# Patient Record
Sex: Female | Born: 1970 | Race: White | Hispanic: No | Marital: Married | Smoking: Never smoker
Health system: Southern US, Community
[De-identification: ages and names within clinical notes are randomized; demographics above are authoritative.]

## PROBLEM LIST (undated history)

## (undated) DIAGNOSIS — I10 Essential (primary) hypertension: Secondary | ICD-10-CM

## (undated) DIAGNOSIS — E785 Hyperlipidemia, unspecified: Secondary | ICD-10-CM

## (undated) HISTORY — DX: Hyperlipidemia, unspecified: E78.5

## (undated) HISTORY — PX: OTHER SURGICAL HISTORY: SHX169

## (undated) HISTORY — DX: Essential (primary) hypertension: I10

## (undated) HISTORY — PX: ORIF WRIST FRACTURE: SHX2133

## (undated) HISTORY — PX: OVARIAN CYST REMOVAL: SHX89

---

## 2009-07-11 ENCOUNTER — Ambulatory Visit: Payer: Self-pay | Admitting: Family Medicine

## 2009-07-31 ENCOUNTER — Ambulatory Visit: Payer: Self-pay | Admitting: Family Medicine

## 2009-07-31 DIAGNOSIS — S93409A Sprain of unspecified ligament of unspecified ankle, initial encounter: Secondary | ICD-10-CM | POA: Insufficient documentation

## 2009-07-31 DIAGNOSIS — E119 Type 2 diabetes mellitus without complications: Secondary | ICD-10-CM

## 2009-12-08 ENCOUNTER — Ambulatory Visit: Payer: Self-pay | Admitting: Family Medicine

## 2009-12-08 DIAGNOSIS — G609 Hereditary and idiopathic neuropathy, unspecified: Secondary | ICD-10-CM | POA: Insufficient documentation

## 2009-12-08 LAB — CONVERTED CEMR LAB: Hgb A1c MFr Bld: 14 %

## 2009-12-09 ENCOUNTER — Encounter: Payer: Self-pay | Admitting: Family Medicine

## 2009-12-09 LAB — CONVERTED CEMR LAB
Albumin: 4.1 g/dL (ref 3.5–5.2)
CO2: 22 meq/L (ref 19–32)
Chloride: 100 meq/L (ref 96–112)
Cholesterol: 198 mg/dL (ref 0–200)
Creatinine, Ser: 0.67 mg/dL (ref 0.40–1.20)
HDL: 30 mg/dL — ABNORMAL LOW (ref >39)
Potassium: 3.9 meq/L (ref 3.5–5.3)

## 2009-12-24 ENCOUNTER — Telehealth: Payer: Self-pay | Admitting: Family Medicine

## 2009-12-28 ENCOUNTER — Ambulatory Visit: Payer: Self-pay | Admitting: Family Medicine

## 2010-01-04 ENCOUNTER — Encounter: Payer: Self-pay | Admitting: Family Medicine

## 2010-01-26 ENCOUNTER — Telehealth: Payer: Self-pay | Admitting: Family Medicine

## 2010-01-28 ENCOUNTER — Encounter: Payer: Self-pay | Admitting: Family Medicine

## 2010-04-15 ENCOUNTER — Ambulatory Visit: Payer: Self-pay | Admitting: Family

## 2010-04-15 LAB — CONVERTED CEMR LAB: Hgb A1c MFr Bld: 10.9 %

## 2010-04-29 ENCOUNTER — Ambulatory Visit: Payer: Self-pay | Admitting: Family

## 2010-05-09 ENCOUNTER — Ambulatory Visit: Payer: Self-pay | Admitting: Emergency Medicine

## 2010-05-09 DIAGNOSIS — IMO0002 Reserved for concepts with insufficient information to code with codable children: Secondary | ICD-10-CM | POA: Insufficient documentation

## 2010-05-09 DIAGNOSIS — M25569 Pain in unspecified knee: Secondary | ICD-10-CM

## 2010-05-10 ENCOUNTER — Encounter: Payer: Self-pay | Admitting: Emergency Medicine

## 2010-05-19 ENCOUNTER — Telehealth: Payer: Self-pay | Admitting: Family

## 2010-05-27 ENCOUNTER — Telehealth: Payer: Self-pay | Admitting: Family Medicine

## 2010-05-31 ENCOUNTER — Ambulatory Visit: Payer: Self-pay | Admitting: Family Medicine

## 2010-06-15 ENCOUNTER — Telehealth: Payer: Self-pay | Admitting: Family Medicine

## 2010-06-15 ENCOUNTER — Encounter: Payer: Self-pay | Admitting: Family Medicine

## 2010-06-16 ENCOUNTER — Ambulatory Visit: Payer: Self-pay | Admitting: Family Medicine

## 2010-06-16 DIAGNOSIS — Z86718 Personal history of other venous thrombosis and embolism: Secondary | ICD-10-CM | POA: Insufficient documentation

## 2010-06-16 LAB — CONVERTED CEMR LAB: INR: 2

## 2010-06-23 ENCOUNTER — Ambulatory Visit: Payer: Self-pay | Admitting: Family Medicine

## 2010-06-23 LAB — CONVERTED CEMR LAB: INR: 4

## 2010-07-07 ENCOUNTER — Ambulatory Visit: Payer: Self-pay | Admitting: Family Medicine

## 2010-07-22 ENCOUNTER — Ambulatory Visit: Payer: Self-pay | Admitting: Family Medicine

## 2010-07-22 LAB — CONVERTED CEMR LAB: INR: 3.4

## 2010-07-29 ENCOUNTER — Ambulatory Visit: Payer: Self-pay | Admitting: Family Medicine

## 2010-07-29 LAB — CONVERTED CEMR LAB: INR: 3.7

## 2010-08-06 ENCOUNTER — Ambulatory Visit: Payer: Self-pay | Admitting: Family Medicine

## 2010-08-06 LAB — CONVERTED CEMR LAB: INR: 2.3

## 2010-08-18 ENCOUNTER — Telehealth (INDEPENDENT_AMBULATORY_CARE_PROVIDER_SITE_OTHER): Payer: Self-pay | Admitting: *Deleted

## 2010-08-20 ENCOUNTER — Ambulatory Visit: Payer: Self-pay | Admitting: Family Medicine

## 2010-08-20 LAB — CONVERTED CEMR LAB: INR: 2.3

## 2010-09-10 ENCOUNTER — Ambulatory Visit: Payer: Self-pay | Admitting: Family Medicine

## 2010-09-10 DIAGNOSIS — I10 Essential (primary) hypertension: Secondary | ICD-10-CM | POA: Insufficient documentation

## 2010-09-10 LAB — CONVERTED CEMR LAB: INR: 1.9

## 2010-09-13 ENCOUNTER — Ambulatory Visit: Payer: Self-pay | Admitting: Family Medicine

## 2010-09-14 ENCOUNTER — Ambulatory Visit: Payer: Self-pay | Admitting: Family Medicine

## 2010-09-16 ENCOUNTER — Telehealth (INDEPENDENT_AMBULATORY_CARE_PROVIDER_SITE_OTHER): Payer: Self-pay | Admitting: *Deleted

## 2010-09-20 ENCOUNTER — Telehealth (INDEPENDENT_AMBULATORY_CARE_PROVIDER_SITE_OTHER): Payer: Self-pay | Admitting: *Deleted

## 2010-10-05 ENCOUNTER — Telehealth: Payer: Self-pay | Admitting: Family Medicine

## 2010-10-21 ENCOUNTER — Ambulatory Visit
Admission: RE | Admit: 2010-10-21 | Discharge: 2010-10-21 | Payer: Self-pay | Source: Home / Self Care | Attending: Family Medicine | Admitting: Family Medicine

## 2010-10-21 LAB — CONVERTED CEMR LAB: INR: 1.5

## 2010-10-25 ENCOUNTER — Ambulatory Visit: Admit: 2010-10-25 | Payer: Self-pay | Admitting: Family Medicine

## 2010-10-28 ENCOUNTER — Ambulatory Visit
Admission: RE | Admit: 2010-10-28 | Discharge: 2010-10-28 | Payer: Self-pay | Source: Home / Self Care | Attending: Family Medicine | Admitting: Family Medicine

## 2010-11-02 ENCOUNTER — Encounter: Payer: Self-pay | Admitting: Family Medicine

## 2010-11-08 ENCOUNTER — Telehealth (INDEPENDENT_AMBULATORY_CARE_PROVIDER_SITE_OTHER): Payer: Self-pay | Admitting: *Deleted

## 2010-11-09 NOTE — Assessment & Plan Note (Signed)
Summary: DRESSING CHANGE   Vital Signs:  Patient Profile:   40 Years Old Female CC:      1 day F/U on Labia Abscess Height:     70 inches Weight:      273 pounds O2 Sat:      96 % O2 treatment:    Room Air Temp:     98.3 degrees F oral Pulse rate:   100 / minute Resp:     16 per minute BP sitting:   118 / 83  (left arm) Cuff size:   large  Vitals Entered By: Lajean Saver RN (September 14, 2010 10:02 AM)                  Updated Prior Medication List: NEURONTIN 300 MG CAPS (GABAPENTIN) Take 1 tablet by mouth three times a day LANTUS SOLOSTAR 100 UNIT/ML SOLN (INSULIN GLARGINE) 80 unit [place] daily * BD ULTRA-FINE NEEDLES use as directed METFORMIN HCL 1000 MG TABS (METFORMIN HCL) one tablet by mouth two times a day HYDROCODONE-ACETAMINOPHEN 5-325 MG TABS (HYDROCODONE-ACETAMINOPHEN) 1-2 tabs by mouth every 6 hours as needed . COUMADIN 5 MG TABS (WARFARIN SODIUM) Sunday - 5 mg, Monday - 5 mg, Tuesday - 5 mg, Wednesday - 5 mg, Thursday - 5 mg, Friday - 6 mg, Saturday - 5 mg COUMADIN 1 MG TABS (WARFARIN SODIUM) Sunday - 5 mg, Monday - 5 mg, Tuesday - 5 mg, Wednesday - 5 mg, Thursday - 5 mg, Friday - 6 mg, Saturday - 5 mg APIDRA SOLOSTAR 100 UNIT/ML SOLN (INSULIN GLULISINE) Per SSI. LISINOPRIL-HYDROCHLOROTHIAZIDE 20-25 MG TABS (LISINOPRIL-HYDROCHLOROTHIAZIDE) 1/2 tab by mouth dialy for one week, then inc to whole tab daily * INSULIN NEEDLES AND SYRINGLES FOR THE APIDRA VIALS. inject three times a day CEPHALEXIN 500 MG TABS (CEPHALEXIN) One by mouth q6hr  Current Allergies (reviewed today): No known allergies History of Present Illness Chief Complaint: 1 day F/U on Labia Abscess History of Present Illness:  Subjective:  Patient reports less pain.  No fever.  REVIEW OF SYSTEMS Constitutional Symptoms      Denies fever, chills, night sweats, weight loss, weight gain, and fatigue.  Eyes       Denies change in vision, eye pain, eye discharge, glasses, contact lenses, and eye  surgery. Ear/Nose/Throat/Mouth       Denies hearing loss/aids, change in hearing, ear pain, ear discharge, dizziness, frequent runny nose, frequent nose bleeds, sinus problems, sore throat, hoarseness, and tooth pain or bleeding.  Respiratory       Denies dry cough, productive cough, wheezing, shortness of breath, asthma, bronchitis, and emphysema/COPD.  Cardiovascular       Denies murmurs, chest pain, and tires easily with exhertion.    Gastrointestinal       Denies stomach pain, nausea/vomiting, diarrhea, constipation, blood in bowel movements, and indigestion. Genitourniary       Denies painful urination, kidney stones, and loss of urinary control.      Comments: abscess on labia Neurological       Denies paralysis, seizures, and fainting/blackouts. Musculoskeletal       Denies muscle pain, joint pain, joint stiffness, decreased range of motion, redness, swelling, muscle weakness, and gout.  Skin       Denies bruising, unusual mles/lumps or sores, and hair/skin or nail changes.  Psych       Denies mood changes, temper/anger issues, anxiety/stress, speech problems, depression, and sleep problems. Other Comments: pt  is here for a F/U for a abscess on  her labia. she states that it is feeling a little better and less pressure today.   Past History:  Past Medical History: Reviewed history from 07/31/2009 and no changes required. Diabetes mellitus, type II  Past Surgical History: Reviewed history from 12/08/2009 and no changes required. R open wrist reduction Ovarian cyst removed 2001 Uterine ablation 2008  Family History: Reviewed history from 12/08/2009 and no changes required. Family History Hypertension Father with esophageal Cancer, HTN Mother with DM.    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  Never Smoked Alcohol use-no Drug use-no Regular  exercise-Yes, jazzercise 6 days a week.    Objective:  Appears comfortable GU:  Left labia less swollen and erythematous.  Decreased tenderness.  Removed packing from I and D site:  no purlent drainage.  Applied new sterile dressing. Assessment New Problems: ENCOUNTER CHANGE/REMOVAL SURGICAL WOUND DRESSING (ICD-V58.31)   Plan New Orders: Est. Patient Level II [16109] Planning Comments:   Culture pending.  Continue antibiotic.  Begin warm soaks daily.  Change dressing daily until healed.  Return for worsening symptoms   The patient and/or caregiver has been counseled thoroughly with regard to medications prescribed including dosage, schedule, interactions, rationale for use, and possible side effects and they verbalize understanding.  Diagnoses and expected course of recovery discussed and will return if not improved as expected or if the condition worsens. Patient and/or caregiver verbalized understanding.   Orders Added: 1)  Est. Patient Level II [60454]

## 2010-11-09 NOTE — Assessment & Plan Note (Signed)
Summary: coumadin check  Nurse Visit   Vitals Entered By: Payton Spark CMA (July 22, 2010 3:05 PM)  Allergies: No Known Drug Allergies Laboratory Results   Blood Tests      INR: 3.4   (Normal Range: 0.88-1.12   Therap INR: 2.0-3.5)    Orders Added: 1)  Fingerstick [36416] 2)  Protime INR [24401]   Anticoagulation Management History:      The patient is on coumadin and comes in today for a routine follow up visit.  She is being anticoagulated because of the first episode of deep venous thrombosis and/or pulmonary embolism.  Anticipated length of treatment is 3-6 months.  Her last INR was 3.9 and today's INR is 3.4.    Anticoagulation Management Assessment/Plan:      The target INR is 2.0-3.0.  She is to have a PT/INR in 1 week.         Current Anticoagulation Instructions: The patient is to hold (do not take) the Thursday dose of coumadin.  The dosage to be resumed includes: Coumadin 5 mg tabs and Coumadin 7.5 mg tabs:  Sunday - 5 mg, Monday - 5 mg, Tuesday - 7.5 mg, Wednesday - 5 mg, Thursday - 5 mg, Friday - 5 mg, Saturday - 5 mg.  Repeat PT/INR in 1 week.

## 2010-11-09 NOTE — Assessment & Plan Note (Signed)
Summary: INFECTED INGROWN HAIR ON LABIA rm 5   Vital Signs:  Patient Profile:   40 Years Old Female CC:      ?infected ingrown hair to labia x 2 days Height:     70 inches Weight:      278 pounds O2 Sat:      97 % O2 treatment:    Room Air Temp:     98.5 degrees F oral Pulse rate:   113 / minute Resp:     20 per minute BP sitting:   122 / 86  (left arm) Cuff size:   large  Pt. in pain?   yes    Location:   labia  Vitals Entered By: Lajean Saver RN (September 13, 2010 8:23 AM)                   Updated Prior Medication List: NEURONTIN 300 MG CAPS (GABAPENTIN) Take 1 tablet by mouth three times a day LANTUS SOLOSTAR 100 UNIT/ML SOLN (INSULIN GLARGINE) 80 unit [place] daily * BD ULTRA-FINE NEEDLES use as directed METFORMIN HCL 1000 MG TABS (METFORMIN HCL) one tablet by mouth two times a day HYDROCODONE-ACETAMINOPHEN 5-325 MG TABS (HYDROCODONE-ACETAMINOPHEN) 1-2 tabs by mouth every 6 hours as needed . COUMADIN 5 MG TABS (WARFARIN SODIUM) Sunday - 5 mg, Monday - 5 mg, Tuesday - 5 mg, Wednesday - 5 mg, Thursday - 5 mg, Friday - 6 mg, Saturday - 5 mg COUMADIN 1 MG TABS (WARFARIN SODIUM) Sunday - 5 mg, Monday - 5 mg, Tuesday - 5 mg, Wednesday - 5 mg, Thursday - 5 mg, Friday - 6 mg, Saturday - 5 mg APIDRA SOLOSTAR 100 UNIT/ML SOLN (INSULIN GLULISINE) Per SSI. LISINOPRIL-HYDROCHLOROTHIAZIDE 20-25 MG TABS (LISINOPRIL-HYDROCHLOROTHIAZIDE) 1/2 tab by mouth dialy for one week, then inc to whole tab daily * INSULIN NEEDLES AND SYRINGLES FOR THE APIDRA VIALS. inject three times a day  Current Allergies: No known allergies History of Present Illness Chief Complaint: ?infected ingrown hair to labia x 2 days History of Present Illness:  Subjective:  Patient complains of two day history of persistent swelling in left labia, moderately painful.  She has been soaking in a hot tub but there has been no drainage.  No fevers, chills, and sweats   REVIEW OF SYSTEMS Constitutional Symptoms  Denies fever, chills, night sweats, weight loss, weight gain, and fatigue.  Eyes       Denies change in vision, eye pain, eye discharge, glasses, contact lenses, and eye surgery. Ear/Nose/Throat/Mouth       Denies hearing loss/aids, change in hearing, ear pain, ear discharge, dizziness, frequent runny nose, frequent nose bleeds, sinus problems, sore throat, hoarseness, and tooth pain or bleeding.  Respiratory       Denies dry cough, productive cough, wheezing, shortness of breath, asthma, bronchitis, and emphysema/COPD.  Cardiovascular       Denies murmurs, chest pain, and tires easily with exhertion.    Gastrointestinal       Denies stomach pain, nausea/vomiting, diarrhea, constipation, blood in bowel movements, and indigestion. Genitourniary       Denies painful urination, kidney stones, and loss of urinary control. Neurological       Denies paralysis, seizures, and fainting/blackouts. Musculoskeletal       Denies muscle pain, joint pain, joint stiffness, decreased range of motion, redness, swelling, muscle weakness, and gout.  Skin       Complains of hair/skni or nail changes.      Denies bruising and unusual mles/lumps  or sores.      Comments: infected ingrown hair to labia Psych       Denies mood changes, temper/anger issues, anxiety/stress, speech problems, depression, and sleep problems.  Past History:  Past Medical History: Reviewed history from 07/31/2009 and no changes required. Diabetes mellitus, type II  Past Surgical History: Reviewed history from 12/08/2009 and no changes required. R open wrist reduction Ovarian cyst removed 2001 Uterine ablation 2008  Family History: Reviewed history from 12/08/2009 and no changes required. Family History Hypertension Father with esophageal Cancer, HTN Mother with DM.    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  Never Smoked Alcohol use-no Drug use-no Regular exercise-Yes, jazzercise 6 days a week.    Objective:  No acute distress  Abdomen:  Nontender without masses or hepatosplenomegaly.  Bowel sounds are present.  No CVA or flank tenderness.  GU:  Left labia is swollen and tense but only mildly tender to palpation.   Assessment New Problems: OTHER ABSCESS OF VULVA (ICD-616.4)   Plan New Medications/Changes: CEPHALEXIN 500 MG TABS (CEPHALEXIN) One by mouth q6hr  #40 x 0, 09/13/2010, Donna Christen MD  New Orders: T-Culture, Wound [[id number]] New Patient Level III [16109] I&D Abscess, Simple / Single [10060] Planning Comments:   Wound culture taken.  Begin Keflex. Return tomorrow for packing removal and dressing change.   The patient and/or caregiver has been counseled thoroughly with regard to medications prescribed including dosage, schedule, interactions, rationale for use, and possible side effects and they verbalize understanding.  Diagnoses and expected course of recovery discussed and will return if not improved as expected or if the condition worsens. Patient and/or caregiver verbalized understanding.   PROCEDURE:   I & D Site: Left Labia Anesthesia: Local 1% lidocaine with epinephrine Procedure: Procedure:  Incise and Drain Abscess Risks and benefits of procedure explained to patient and verbal consent granted.  Using sterile technique and local anesthesia with 1% lidocaine with epinephrine, cleansed affected area with Betadine. Identified the most fluctuant area of lesion and incised with a #11 blade.  Expressed minimal amount of blood and purulent material.  Inserted Iodoform gauze packing.  Bandage applied.  Patient tolerated well.  Disposition: Home Prescriptions: CEPHALEXIN 500 MG TABS (CEPHALEXIN) One by mouth q6hr  #40 x 0   Entered and Authorized by:   Donna Christen MD   Signed by:   Donna Christen MD on 09/13/2010   Method used:   Print then Give to  Patient   RxID:   [id number]   Orders Added: 1)  T-Culture, Wound [[id number]] 2)  New Patient Level III [95621] 3)  I&D Abscess, Simple / Single [10060]

## 2010-11-09 NOTE — Progress Notes (Signed)
  Phone Note Outgoing Call   Call placed by: Clemens Catholic LPN,  September 16, 2010 3:45 PM Call placed to: Patient Summary of Call: call back: called and left message and asked pt to call back. Wound culture: positive for strep not staph. Cephalexin should clear this infection if not we can change or add another ABT per Dr. Orson Aloe. Initial call taken by: Clemens Catholic LPN,  September 16, 2010 3:48 PM

## 2010-11-09 NOTE — Letter (Signed)
Summary: Letter Regarding Disease Mgmt Program/[facility]  Letter Regarding Disease Mgmt Program/[facility]   Imported By: Lanelle Bal 02/04/2010 09:55:05  _____________________________________________________________________  External Attachment:    Type:   Image     Comment:   External Document

## 2010-11-09 NOTE — Assessment & Plan Note (Signed)
Summary: INR ck  Nurse Visit   Allergies: No Known Drug Allergies Laboratory Results   Blood Tests   Date/Time Received: 08/20/10 Date/Time Reported: 08/20/10   INR: 2.3   (Normal Range: 0.88-1.12   Therap INR: 2.0-3.5)    Orders Added: 1)  Fingerstick [36416] 2)  Protime INR [85610]   Anticoagulation Management History:      The patient is on coumadin and comes in today for a routine follow up visit.  Coumadin therapy is being given due to the first episode of deep venous thrombosis and/or pulmonary embolism.  Anticipated length of treatment is 3-6 months.  Her last INR was 2.3 and today's INR is 2.3.    Anticoagulation Management Assessment/Plan:      The target INR is 2.0-3.0.  She is to have a PT/INR in 3 weeks.  Anticoagulation instructions were given to patient.         Current Anticoagulation Instructions: The patient is to continue with the same dose of coumadin.  This dosage includes: Coumadin 5 mg tabs:  Sunday - 5 mg, Monday - 5 mg, Tuesday - 5 mg, Wednesday - 5 mg, Thursday - 5 mg, Friday - 5 mg, Saturday - 5 mg.  Repeat PT/INR in 3 weeks.

## 2010-11-09 NOTE — Assessment & Plan Note (Signed)
Summary: NOV: DM   Vital Signs:  Patient profile:   40 year old female Height:      70 inches Weight:      275 pounds BMI:     39.60 O2 Sat:      97 % on Room air Temp:     97.7 degrees F oral Pulse rate:   88 / minute Pulse rhythm:   regular Resp:     18 per minute BP sitting:   123 / 86  (left arm) Cuff size:   large  Vitals Entered By: Glendell Docker CMA (December 08, 2009 10:22 AM)  O2 Flow:  Room air  CC: New Patient  Comments establish care, no concerns, refill on medication, no regular pcp   Primary Care Provider:  Nani Gasser MD  CC:  New Patient .  History of Present Illness: No hx of normal paps.  Had ablation 2 months later for heavy bleeeding. Needs new pap. Had screenign mammo in 2009.    On glucophage for Diabetes for 6 years. On amaryl for that long.  Last A1C was a year ago was 7.?   In the meantime has lost 4 lbs.  Exercising regularly and has lost about 20 lbs over the last year. Off of diabetic medication for over a month because ran out. Does have hx of neuropathy. Describes burning in her feet in the evenings. No pain or problems during the day.   Diabetes Management History:      The patient is a 40 years old female who comes in for evaluation of DM Type 2.  She says that she is not exercising regularly.        Other comments include: Not really checking her sugars .    Habits & Providers  Alcohol-Tobacco-Diet     Alcohol drinks/day: 0     Tobacco Status: never  Exercise-Depression-Behavior     Does Patient Exercise: yes     STD Risk: never     Drug Use: never  Allergies (verified): No Known Drug Allergies  Past History:  Past Surgical History: R open wrist reduction Ovarian cyst removed 2001 Uterine ablation 2008  Family History: Family History Hypertension Father with esophageal Cancer, HTN Mother with DM.    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  Never  Smoked Alcohol use-no Drug use-no Regular exercise-Yes, jazzercise 6 days a week.  Does Patient Exercise:  yes Drug Use:  never STD Risk:  never  Review of Systems       No fever/sweats/weakness, unexplained weight loss/gain.  No vison changes.  No difficulty hearing/ringing in ears, hay fever/allergies.  No chest pain/discomfort, palpitations.  No Br lump/nipple discharge.  No cough/wheeze.  No blood in BM, nausea/vomiting/diarrhea.  No nighttime urination, leaking urine, unusual vaginal bleeding, discharge (penis or vagina).  No muscle/joint pain. No rash, change in mole.  No HA, memory loss.  No anxiety, sleep d/o, depression.  No easy bruising/bleeding, unexplained lump   Physical Exam  General:  Well-developed,well-nourished,in no acute distress; alert,appropriate and cooperative throughout examination Head:  Normocephalic and atraumatic without obvious abnormalities. No apparent alopecia or balding. Eyes:  No corneal or conjunctival inflammation noted. EOMI. Perrla. Neck:  No deformities, masses, or tenderness noted. Lungs:  Normal respiratory effort, chest expands symmetrically. Lungs are clear to auscultation, no crackles or wheezes. Heart:  Normal rate and regular rhythm. S1 and S2 normal without gallop, murmur, click, rub or  other extra sounds. N ocarotid or abdominal bruits.  Abdomen:  Bowel sounds positive,abdomen soft and non-tender without masses, organomegaly or hernias noted. Pulses:  Radial 2+  Extremities:  No LE edema.  Skin:  no rashes.   Cervical Nodes:  No lymphadenopathy noted Psych:  Cognition and judgment appear intact. Alert and cooperative with normal attention span and concentration. No apparent delusions, illusions, hallucinations  Diabetes Management Exam:    Foot Exam (with socks and/or shoes not present):       Sensory-Pinprick/Light touch:          Left medial foot (L-4): normal          Left dorsal foot (L-5): normal          Left lateral foot (S-1):  normal          Right medial foot (L-4): normal          Right dorsal foot (L-5): normal          Right lateral foot (S-1): normal       Sensory-Monofilament:          Left foot: normal          Right foot: normal       Inspection:          Left foot: normal          Right foot: normal       Nails:          Left foot: normal          Right foot: normal   Impression & Recommendations:  Problem # 1:  DIABETES MELLITUS, TYPE II (ICD-250.00) Tetanus adn flu up to date.  Due for pneumococcal, given today.  Norml monofilament. REminded her to get her eye exam. Due for screening labs.  Spilling protein in urine, so will recheck once back on medication and the A1C under better control.  A!C is high but not suprising since off medication for almost 2 months. Will change the amaryl since pt is trying really hard to lose weight.   F/U in 1 month.  Bring in glucose log.  Says has a meter at home.  The following medications were removed from the medication list:    Glucophage 1000 Mg Tabs (Metformin hcl) .Marland Kitchen... 1 tab by mouth two times a day    Glipizide 5 Mg Tabs (Glipizide) .Marland Kitchen... 1 tab by mouth once daily Her updated medication list for this problem includes:    Janumet 50-1000 Mg Tabs (Sitagliptin-metformin hcl) .Marland Kitchen... Take 1 tablet by mouth two times a day  Orders: T-Comprehensive Metabolic Panel [id number]) T-Lipid Profile (09811-91478) T-TSH (29562-13086) Creatinine  (57846) Urine Microalbumin (82044) Hgb A1C (96295MW) Fingerstick (41324)  Complete Medication List: 1)  Janumet 50-1000 Mg Tabs (Sitagliptin-metformin hcl) .... Take 1 tablet by mouth two times a day 2)  Neurontin 300 Mg Caps (Gabapentin) .... Take 1 capsule by mouth two times a day  Other Orders: Pneumococcal Vaccine (40102) Admin 1st Vaccine (72536) Prescriptions: NEURONTIN 300 MG CAPS (GABAPENTIN) Take 1 capsule by mouth two times a day  #60 x 6   Entered and Authorized by:   Nani Gasser MD   Signed  by:   Nani Gasser MD on 12/08/2009   Method used:   Electronically to        [facility] [phone]* (retail)       [address] [place], [place]  [address]  Ph: [phone]       Fax: [phone]   RxID:   [id number] JANUMET 50-1000 MG TABS (SITAGLIPTIN-METFORMIN HCL) Take 1 tablet by mouth two times a day  #60 x 2   Entered and Authorized by:   Nani Gasser MD   Signed by:   Nani Gasser MD on 12/08/2009   Method used:   Electronically to        [facility] [phone]* (retail)       [address] [place], [place]  [address]       Ph: [phone]       Fax: [phone]   RxID:   [id number]    Immunization History:  Tetanus/Td Immunization History:    Tetanus/Td:  historical (02/17/2004)  Immunizations Administered:  Pneumonia Vaccine:    Vaccine Type: Pneumovax    Site: right deltoid    Mfr: Merck    Dose: 0.5 ml    Route: IM    Given by: Glendell Docker CMA    Exp. Date: 11/10/2010    Lot #: [id number]    VIS given: 07/15/2008    Preventive Care Screening  Pap Smear:    Date:  04/01/2008    Results:  normal   Last Tetanus Booster:    Date:  02/17/2004    Results:  Historical    Current Allergies (reviewed today): No known allergies   TD Result Date:  10/11/2003 TD Result:  given TD Next Due:  10 yr  Laboratory Results   Urine Tests    Microalbumin (urine): 80 mg/L Creatinine: 200mg /dL  A:C Ratio 01-601  Blood Tests     HGBA1C: 14.0%   (Normal Range: Non-Diabetic - 3-6%   Control Diabetic - 6-8%)

## 2010-11-09 NOTE — Assessment & Plan Note (Signed)
Summary: INR  Nurse Visit   Allergies: No Known Drug Allergies Laboratory Results   Blood Tests   Date/Time Received: 07/29/2010 Date/Time Reported: 07/29/2010   INR: 3.7   (Normal Range: 0.88-1.12   Therap INR: 2.0-3.5)    Orders Added: 1)  Fingerstick [36416] 2)  Protime INR [85610]   Anticoagulation Management History:      The patient is on coumadin and comes in today for a routine follow up visit.  She is being anticoagulated because of the first episode of deep venous thrombosis and/or pulmonary embolism.  Anticipated length of treatment is 3-6 months.  Her last INR was 3.4 and today's INR is 3.7.    Anticoagulation Management Assessment/Plan:      The target INR is 2.0-3.0.  She is to have a PT/INR in 1 week.  Anticoagulation instructions were given to patient.         Current Anticoagulation Instructions: The patient is to hold (do not take) the Thursday dose of coumadin.  The dosage to be resumed includes: The patient is to hold (do not take) the Friday dose of coumadin.  The dosage to be resumed includes: Repeat PT/INR in 1 week.  Coumadin 5 mg tabs:  Sunday - 5 mg, Monday - 5 mg, Tuesday - 5 mg, Wednesday - 5 mg, Thursday - 5 mg, Friday - 5 mg, Saturday - 5 mg.

## 2010-11-09 NOTE — Letter (Signed)
Summary: [facility]  [facility]   Imported By: Lanelle Bal 06/24/2010 11:29:28  _____________________________________________________________________  External Attachment:    Type:   Image     Comment:   External Document

## 2010-11-09 NOTE — Assessment & Plan Note (Signed)
Summary: KNEE INJURY/TM   Vital Signs:  Patient Profile:   40 Years Old Female CC:      Left knee pain twisted knee coming off waterslide 3 days ago Height:     70 inches Weight:      270 pounds O2 Sat:      97 % O2 treatment:    Room Air Temp:     97.4 degrees F oral Pulse rate:   88 / minute Pulse rhythm:   regular Resp:     18 per minute BP sitting:   138 / 92  (right arm) Cuff size:   large  Pt. in pain?   yes    Location:   knee    Intensity:   5    Type:       dull  Vitals Entered By: Emilio Math (May 09, 2010 4:41 PM)                   Current Allergies (reviewed today): No known allergies History of Present Illness Chief Complaint: Left knee pain twisted knee coming off waterslide 3 days ago History of Present Illness: Was on a waterslide 3 days ago and at the bottom felt like she tweaked her knee. Was able to keep doing things and felt fine for a while. Then over the next 1-2 days, felt more pain and it feels swollen.  Pain is on the lateral aspect of her left knee.  No popping, locking, giving way.  She is able to walk but it hurts because the knee is painful to bend. No OTC meds help.  Current Meds NEURONTIN 300 MG CAPS (GABAPENTIN) Take 1 tablet by mouth three times a day LANTUS SOLOSTAR 100 UNIT/ML SOLN (INSULIN GLARGINE) take as directed * BD ULTRA-FINE NEEDLES use as directed METFORMIN HCL 1000 MG TABS (METFORMIN HCL) one tablet by mouth two times a day  REVIEW OF SYSTEMS Constitutional Symptoms      Denies fever, chills, night sweats, weight loss, weight gain, and fatigue.  Eyes       Denies change in vision, eye pain, eye discharge, glasses, contact lenses, and eye surgery. Ear/Nose/Throat/Mouth       Denies hearing loss/aids, change in hearing, ear pain, ear discharge, dizziness, frequent runny nose, frequent nose bleeds, sinus problems, sore throat, hoarseness, and tooth pain or bleeding.  Respiratory       Denies dry cough, productive cough,  wheezing, shortness of breath, asthma, bronchitis, and emphysema/COPD.  Cardiovascular       Denies murmurs, chest pain, and tires easily with exhertion.    Gastrointestinal       Denies stomach pain, nausea/vomiting, diarrhea, constipation, blood in bowel movements, and indigestion. Genitourniary       Denies painful urination, kidney stones, and loss of urinary control. Neurological       Denies paralysis, seizures, and fainting/blackouts. Musculoskeletal       Complains of muscle pain, joint pain, joint stiffness, and decreased range of motion.      Denies redness, swelling, muscle weakness, and gout.  Skin       Denies bruising, unusual mles/lumps or sores, and hair/skin or nail changes.  Psych       Denies mood changes, temper/anger issues, anxiety/stress, speech problems, depression, and sleep problems.  Past History:  Past Medical History: Reviewed history from 07/31/2009 and no changes required. Diabetes mellitus, type II  Past Surgical History: Reviewed history from 12/08/2009 and no changes required. R open wrist reduction Ovarian  cyst removed 2001 Uterine ablation 2008  Family History: Reviewed history from 12/08/2009 and no changes required. Family History Hypertension Father with esophageal Cancer, HTN Mother with DM.    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 married to [place]oga with one child.  Never Smoked Alcohol use-no Drug use-no Regular exercise-Yes, jazzercise 6 days a week.  Physical Exam General appearance: well developed, obese, no acute distress Neurological: grossly intact and non-focal Skin: no obvious rashes or lesions Left knee: ROM is limited due to pain, patella freely mobile, very minimal effusion, no ecchymoses, no deformity.  TTP lateral joint line (on the lateral side as well as posterior) and w/ McMurray and with varus/valgus stress.  No medial pain.   Her gait is slightly antalgic.  Difficult to perform Lachman's due to guarding. Assessment New Problems: KNEE SPRAIN, LEFT (ICD-844.9) KNEE PAIN, LEFT (ICD-719.46)  Likely lateral meniscal pathology with possible LCLsprain Xray is normal  Patient Education: Patient and/or caregiver instructed in the following: rest, Ibuprofen prn. ice frequently  Plan New Orders: T-DG Knee Complete 4 Views*L* [73564] Est. Patient Level III [16109] Planning Comments:   To follow up with sports med this week (Dr. Margaretha Sheffield or Dr. Penni Bombard).   The patient and/or caregiver has been counseled thoroughly with regard to medications prescribed including dosage, schedule, interactions, rationale for use, and possible side effects and they verbalize understanding.  Diagnoses and expected course of recovery discussed and will return if not improved as expected or if the condition worsens. Patient and/or caregiver verbalized understanding.   Orders Added: 1)  T-DG Knee Complete 4 Views*L* [73564] 2)  Est. Patient Level III [60454]

## 2010-11-09 NOTE — Assessment & Plan Note (Signed)
Summary: INR check - jr  Nurse Visit   Allergies: No Known Drug Allergies Laboratory Results   Blood Tests   Date/Time Received: 06/23/10 Date/Time Reported: 06/23/10   INR: 4.0   (Normal Range: 0.88-1.12   Therap INR: 2.0-3.5)    Orders Added: 1)  Fingerstick [36416] 2)  Protime INR [85610]   Anticoagulation Management History:      The patient is on coumadin and comes in today for a routine follow up visit.  She is being anticoagulated because of the first episode of deep venous thrombosis and/or pulmonary embolism.  Anticipated length of treatment is 3-6 months.  Her last INR was 2.0 and today's INR is 4.0.    Anticoagulation Management Assessment/Plan:      The target INR is 2.0-3.0.  Anticoagulation instructions were given to patient.         Current Anticoagulation Instructions: Hold dose today and tomorrow.  Then start 5 mg on MON and FRI and 7.5 mg all other days.  Recheck in 10 days.

## 2010-11-09 NOTE — Progress Notes (Signed)
Summary: Podiatry referral  Phone Note From Other Clinic   Summary of Call: Pt cancelled podiatry appt in July 2011, can I cancel the referral? Initial call taken by: Lannette Donath,  May 19, 2010 2:52 PM  Follow-up for Phone Call        OK to cancel. Follow-up by: Lemont Fillers FNP,  May 19, 2010 3:23 PM

## 2010-11-09 NOTE — Progress Notes (Signed)
Summary: elevated blood sugars  Phone Note Call from Patient Call back at [phone]   Caller: Patient Call For: Nani Gasser MD Summary of Call: put on Janumet twice a day for blood sugars has went from 450 in the mornings to 350 still high. Please advise on what to do Initial call taken by: Kathlene November,  December 24, 2009 11:36 AM  Follow-up for Phone Call        Go ahead and f/u next week instead of waiting for 1 mo f/u.  We will have to add a shot for ebetter control. I am glad sugars are better but still too high.   Follow-up by: Nani Gasser MD,  December 24, 2009 11:55 AM  Additional Follow-up for Phone Call Additional follow up Details #1::        Pt notified of MD instructions Additional Follow-up by: Kathlene November,  December 24, 2009 11:57 AM

## 2010-11-09 NOTE — Progress Notes (Signed)
Summary: meds  Phone Note Call from Patient   Caller: Patient Call For: Nani Gasser MD Summary of Call: Pt called and said you wanted her to let you know how many units she is at with her lantus.Pt is taking 80 units and blood sugars are running from 170-200 in the am. needs refill of the lantus sent to [facility] and the rx needs to say 80 units because they only give her 10 units Initial call taken by: Avon Gully CMA, Duncan Dull),  May 27, 2010 8:18 AM  Follow-up for Phone Call        rx sent. f/u this month if fasting sugars are still <150.   Follow-up by: Nani Gasser MD,  May 27, 2010 8:54 AM  Additional Follow-up for Phone Call Additional follow up Details #1::        pt notified Additional Follow-up by: Avon Gully CMA, Duncan Dull),  May 27, 2010 8:57 AM    New/Updated Medications: LANTUS SOLOSTAR 100 UNIT/ML SOLN (INSULIN GLARGINE) 80 unit [place] daily Prescriptions: LANTUS SOLOSTAR 100 UNIT/ML SOLN (INSULIN GLARGINE) 80 unit [place] daily  #30 day suppl x 1   Entered and Authorized by:   Nani Gasser MD   Signed by:   Nani Gasser MD on 05/27/2010   Method used:   Electronically to        [facility] [phone]* (retail)       [address] [place], [place]  [address]       Ph: [phone]       Fax: [phone]   RxID:   [id number]

## 2010-11-09 NOTE — Assessment & Plan Note (Signed)
Summary: 2 WEEK FOLLOW UP FOR MED CHECK   Vital Signs:  Patient profile:   40 year old female Height:      70 inches Weight:      277 pounds Pulse rate:   94 / minute BP sitting:   132 / 78  (left arm) Cuff size:   large  Vitals Entered By: Danielle Hodges CMA, Duncan Dull) (April 29, 2010 3:52 PM) CC: f/u blood sugar blood sugar this am 253 Is Patient Diabetic? Yes Did you bring your meter with you today? No   Primary Care Provider:  Nani Gasser MD  CC:  f/u blood sugar blood sugar this am 253.  History of Present Illness: Danielle Hodges is a 40 year old female who presents today for follow up of her uncontrolled diabetes.  Last visit her Janumet and Byetta were discontinued and she was started on Lantus insulin.  She has been titrating her Lantus upward by one unit every night with the goal of a fasting glucose <150.  Patient took 45 units last night- fasting sugar was 253 this AM.  This is much improved from her former sugars prior to Lantus which were at times over 400.   Patient denies any symptomatic hypoglycemia.  Feels well.  Current Medications (verified): 1)  Neurontin 300 Mg Caps (Gabapentin) .... Take 1 Tablet By Mouth Three Times A Day 2)  Byetta 10 Mcg Pen 10 Mcg/0.65ml Soln (Exenatide) .Marland Kitchen.. 10 Micrograms [place] Two Times A Day 3)  Lantus Solostar 100 Unit/ml Soln (Insulin Glargine) .... Start 10 Units Subcutaneously At Bedtime 4)  Bd Ultra-Fine Needles .... Use As Directed 5)  Metformin Hcl 1000 Mg Tabs (Metformin Hcl) .... One Tablet By Mouth Two Times A Day  Allergies (verified): No Known Drug Allergies  Comments:  Nurse/Medical Assistant: The patient's medications and allergies were reviewed with the patient and were updated in the Medication and Allergy Lists. Danielle Hodges CMA, Duncan Dull) (April 29, 2010 3:54 PM)  Past History:  Past Medical History: Last updated: 07/31/2009 Diabetes mellitus, type II  Past Surgical History: Last updated: 12/08/2009 R open  wrist reduction Ovarian cyst removed 2001 Uterine ablation 2008  Physical Exam  General:  Morbidly obese white female awake, alert, and NAD Lungs:  Normal respiratory effort, chest expands symmetrically. Lungs are clear to auscultation, no crackles or wheezes. Heart:  Normal rate and regular rhythm. S1 and S2 normal without gallop, murmur, click, rub or other extra sounds.   Impression & Recommendations:  Problem # 1:  DIABETES MELLITUS, TYPE II (ICD-250.00) Assessment Improved Sample of Lantus solostar was given to patient.  Will plan to increase lantus by one unit each night until fasting sugar is <150.  Pt was instructed not to exceed 55 units before her follow up visit with Dr. Linford Arnold.   The following medications were removed from the medication list:    Byetta 10 Mcg Pen 10 Mcg/0.50ml Soln (Exenatide) .Marland KitchenMarland KitchenMarland KitchenMarland Kitchen 10 micrograms [place] two times a day Her updated medication list for this problem includes:    Lantus Solostar 100 Unit/ml Soln (Insulin glargine) .Marland Kitchen... Take as directed    Metformin Hcl 1000 Mg Tabs (Metformin hcl) ..... One tablet by mouth two times a day  Complete Medication List: 1)  Neurontin 300 Mg Caps (Gabapentin) .... Take 1 tablet by mouth three times a day 2)  Lantus Solostar 100 Unit/ml Soln (Insulin glargine) .... Take as directed 3)  Bd Ultra-fine Needles  .... Use as directed 4)  Metformin Hcl 1000 Mg  Tabs (Metformin hcl) .... One tablet by mouth two times a day  Patient Instructions: 1)  Please continue to increase your lantus insulin by one unit each day until you reach goal fasting sugar of less thatn 150.  Do not go over 55 units before you see [place] back in the office. 2)  Follow up in 1 month with Dr. Linford Arnold. Prescriptions: LANTUS SOLOSTAR 100 UNIT/ML SOLN (INSULIN GLARGINE) start 10 units subcutaneously at bedtime  #1 x 3   Entered and Authorized by:   Lemont Fillers FNP   Signed by:   Lemont Fillers FNP on 04/29/2010   Method used:    Electronically to        [facility] [phone]* (retail)       [address] [place], [place]  [address]       Ph: [phone]       Fax: [phone]   RxID:   [id number]

## 2010-11-09 NOTE — Progress Notes (Signed)
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 Pt is going to need to be followed for her coumadin tx.Pt'd INR was 1.8. Pt is now on 5mg  of coumadin and taking 7.5 daily. Pt is also on Lovanox injections 2 x a day untill INR is 2.0 or greater.    Initial call taken by: Avon Gully CMA, Duncan Dull),  June 15, 2010 8:47 AM  Follow-up for Phone Call        Recheck INR with Nurse visit tomorrow. and can adjust her coumadin;  Follow-up by: Nani Gasser MD,  June 15, 2010 8:55 AM  Additional Follow-up for Phone Call Additional follow up Details #1::        LMOM of above instru ctions and advised pt to call our office back to schedule nurse visit. KJ LPN Additional Follow-up by: Kathlene November,  June 15, 2010 10:54 AM

## 2010-11-09 NOTE — Assessment & Plan Note (Signed)
Summary: LEFT KNEE PAIN, worse s/p fall   Vital Signs:  Patient profile:   40 year old female Height:      70 inches Weight:      278 pounds Pulse rate:   99 / minute BP sitting:   150 / 91  (right arm) Cuff size:   large  Vitals Entered By: Avon Gully CMA, Duncan Dull) (May 31, 2010 3:38 PM) CC: Left knee pain , injured knee 4 wks ago, has ortho appt in 3 weeks pain increases since initial injury, cant bend ,very painful   Primary Care Provider:  Nani Gasser MD  CC:  Left knee pain , injured knee 4 wks ago, has ortho appt in 3 weeks pain increases since initial injury, cant bend , and very painful.  History of Present Illness: Left knee pain , injured knee 4 wks ago, has ortho appt in 3 weeks pain increases since initial injury, cant bend ,very painful.  Injury occured in her knee going down a waterslide in [place]. Was able to walk on it initally but got worse over the weekend.  Went to UC adn see downstairs. See note from 7-31. Was getting some better andthen on Friday the knee gave out.  Has happened 2 more time and yesterday knee giving out caused a fall. Now swollen and painful.  TAking Aleve and was helping until her fall yesterday. Pain kept her awake last night.  Reviewed UC note from 05-09-2010.    Current Medications (verified): 1)  Neurontin 300 Mg Caps (Gabapentin) .... Take 1 Tablet By Mouth Three Times A Day 2)  Lantus Solostar 100 Unit/ml Soln (Insulin Glargine) .... 80 Unit [place] Daily 3)  Bd Ultra-Fine Needles .... Use As Directed 4)  Metformin Hcl 1000 Mg Tabs (Metformin Hcl) .... One Tablet By Mouth Two Times A Day  Allergies (verified): No Known Drug Allergies  Comments:  Nurse/Medical Assistant: The patient's medications and allergies were reviewed with the patient and were updated in the Medication and Allergy Lists. Avon Gully CMA, Duncan Dull) (May 31, 2010 3:39 PM)  Physical Exam  General:  Well-developed,well-nourished,in no acute  distress; alert,appropriate and cooperative throughout examination Msk:  Left knee with trace edema.  Flexion to about 30 degrees. Unable to fully extend.  TTP over the superior edge of the patella and along the lateral joint line. Limping gait. Didn't perform a McMurrays secondary to pain.  Extremities:  No LE edema.  Skin:  no rashes.     Impression & Recommendations:  Problem # 1:  KNEE SPRAIN, LEFT (ICD-844.9)  She was improving until her knee gave out yesterday.  Continue teh Aleve and will give hydrocodone for the acute pain. Avoid driving. Will get her in with ortho ths week for further evaluation.  Given crutches since she is having a hard time walking on it.  Ice aggresively. Rec knee sleeve to help with swelling and give support so it doesn't give out.   Orders: Crutches (D6387)  Complete Medication List: 1)  Neurontin 300 Mg Caps (Gabapentin) .... Take 1 tablet by mouth three times a day 2)  Lantus Solostar 100 Unit/ml Soln (Insulin glargine) .... 80 unit [place] daily 3)  Bd Ultra-fine Needles  .... Use as directed 4)  Metformin Hcl 1000 Mg Tabs (Metformin hcl) .... One tablet by mouth two times a day 5)  Hydrocodone-acetaminophen 5-325 Mg Tabs (Hydrocodone-acetaminophen) .Marland Kitchen.. 1-2 tabs by mouth every 6 hours as needed .  Patient Instructions: 1)  Use crutches until your  appointment on Friday. 2)  You have an ortho appointment on Friday, 8/27   at 11 AM downstairs.  3)  Can use the hydrocodone as needed but still continue to ice aggressively and use the Aleve 4)  Can get a knee sleeve for comfort and to help prevent the giving out of the knee. If too uncomfortable to wear then wait until some of the swelling goes down and try again.  Prescriptions: HYDROCODONE-ACETAMINOPHEN 5-325 MG TABS (HYDROCODONE-ACETAMINOPHEN) 1-2 tabs by mouth every 6 hours as needed .  #45 x 0   Entered and Authorized by:   Nani Gasser MD   Signed by:   Nani Gasser MD on 05/31/2010    Method used:   Printed then faxed to ...       [address] [phone]* (retail)       [address] [place], [place]  [address]       Ph: [phone]       Fax: [phone]   RxID:   [id number]

## 2010-11-09 NOTE — Progress Notes (Signed)
Summary: Sugars  Phone Note Call from Patient Call back at [phone]   Caller: Patient Call For: Nani Gasser MD Summary of Call: on Byetta 5mg  for 3 weeks now. Am sugars in the 200's and during the day sugars are 100-150. Given samples and needs rx called into pharmacy but needs the 10mg  now. Also would like for you increase her Neurontin to 600mg  twice a day Initial call taken by: Kathlene November,  January 26, 2010 12:41 PM  Follow-up for Phone Call        new pen sent. F/U in one month on blood sugars in the office.  For the neurontin next step is to go up to three times a day.  This drug is really meant to be a three times a day drug. [facility]  Follow-up by: Nani Gasser MD,  January 26, 2010 12:47 PM  Additional Follow-up for Phone Call Additional follow up Details #1::        Pt notified of above instructions. Additional Follow-up by: Kathlene November,  January 26, 2010 1:04 PM    New/Updated Medications: NEURONTIN 300 MG CAPS (GABAPENTIN) Take 1 tablet by mouth three times a day BYETTA 10 MCG PEN 10 MCG/0.04ML SOLN (EXENATIDE) 10 micrograms [place] two times a day Prescriptions: NEURONTIN 300 MG CAPS (GABAPENTIN) Take 1 tablet by mouth three times a day  #90 x 1   Entered and Authorized by:   Nani Gasser MD   Signed by:   Nani Gasser MD on 01/26/2010   Method used:   Electronically to        [facility] [phone]* (retail)       [address] [place], [place]  [address]       Ph: [phone]       Fax: [phone]   RxID:   [id number] BYETTA 10 MCG PEN 10 MCG/0.04ML SOLN (EXENATIDE) 10 micrograms [place] two times a day  #1 pen x 0   Entered and Authorized by:   Nani Gasser MD   Signed by:   Nani Gasser MD on 01/26/2010   Method used:   Electronically to        [facility] [phone]* (retail)       [address] [place], [place]  [address]       Ph: [phone]       Fax: [phone]   RxID:   [id number]

## 2010-11-09 NOTE — Assessment & Plan Note (Signed)
Summary: Coumadin check-jr  Nurse Visit   Allergies: No Known Drug Allergies Laboratory Results   Blood Tests   Date/Time Received: 06/15/10 Date/Time Reported: 06/15/10   INR: 2.0   (Normal Range: 0.88-1.12   Therap INR: 2.0-3.5)    Orders Added: 1)  Fingerstick [36416] 2)  Protime INR [85610]   Anticoagulation Management History:      The patient is on coumadin and comes in today for a routine follow up visit.  She is being anticoagulated because of the first episode of deep venous thrombosis and/or pulmonary embolism.  Anticipated length of treatment is 3-6 months.  Today's INR is 2.0.    Anticoagulation Management Assessment/Plan:      The target INR is 2.0-3.0.  She is to have a PT/INR in 1 week.  Anticoagulation instructions were given to patient.         Current Anticoagulation Instructions: The patient is to continue with the same dose of coumadin.  This dosage includes: Coumadin 5 mg tabs:  Sunday - 7.5 mg, Monday - 7.5 mg, Tuesday - 7.5 mg, Wednesday - 7.5 mg, Thursday - 7.5 mg, Friday - 7.5 mg, Saturday - 7.5 mg.  Repeat PT/INR in 1 week.  Can stop the lovenox injection.

## 2010-11-09 NOTE — Assessment & Plan Note (Signed)
Summary: F/U DM   Vital Signs:  Patient profile:   40 year old female Height:      70 inches Weight:      280 pounds Pulse rate:   80 / minute BP sitting:   126 / 76  (left arm) Cuff size:   large  Vitals Entered By: Kathlene November (December 28, 2009 8:38 AM) CC: blood sugars running in 300's in the am and 200 during day Is Patient Diabetic? Yes   Primary Care Provider:  Nani Gasser MD  CC:  blood sugars running in 300's in the am and 200 during day.  History of Present Illness: Danielle Hodges is a 40 year old female presenting with uncontrolled blood sugar. She typically checks her blood sugar before breakfast, before lunch, and before dinner. Breafast has been in 330s, lunch in 250s, and dinner 210s-220s. Her sugar today in the office is 227. She is taking her Janumet and is not having any side effects. 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 No chest pain or shortness of breath. She is incorporating more vegetables and protein into her diet. She has gained 5 pounds since she was last here 3 weeks ago but is not sure why.  Diabetes Management History:      The patient is a 40 years old female who comes in for evaluation of DM Type 2.  She says that she is exercising.        Hypoglycemic symptoms are not occurring.    Current Medications (verified): 1)  Janumet 50-1000 Mg Tabs (Sitagliptin-Metformin Hcl) .... Take 1 Tablet By Mouth Two Times A Day 2)  Neurontin 300 Mg Caps (Gabapentin) .... Take 1 Capsule By Mouth Two Times A Day  Allergies (verified): No Known Drug Allergies  Comments:  Nurse/Medical Assistant: The patient's medications and allergies were reviewed with the patient and were updated in the Medication and Allergy Lists. Kathlene November (December 28, 2009 8:39 AM)  Physical Exam  General:  Pleasant obese-appearing female in no acute distress.  Head:  Normocephalic and atraumatic.  Eyes:  Sclera clear with no corneal or conjunctival inflammation noted.  Mouth:  Oropharynx  clear with no lesions or exudate.  Neck:  Supple. Difficult to assess given obesity.  Lungs:  Normal respiratory effort, chest expands symmetrically. Lungs are clear to auscultation, no crackles or wheezes. Heart:  Normal rate and regular rhythm. S1 and S2 normal without gallop, murmur, click, rub or other extra sounds. Pulses:  2+ radial pulses bilaterally. Extremities:  No clubbing, cyanosis, or edema.  Skin:  no rashes.   Psych:  Interacting appropriately with normal concentration and attention.    Impression & Recommendations:  Problem # 1:  DIABETES MELLITUS, TYPE II (ICD-250.00) Will add Byetta to her regimen for better control and weight loss. Start with bid. Warned about potential SE.  Sample given.  Congratulated patient on healthy diet and exercise and encouraged continued weight loss efforts. Follow up in two weeks to reevaluate blood sugar.  Has eye exam schedule for next week.     Janumet 50-1000 Mg Tabs (Sitagliptin-metformin hcl) .Marland Kitchen... Take 1 tablet by mouth two times a day    Byetta 5 Mcg Pen 5 Mcg/0.15ml Soln (Exenatide) .Marland KitchenMarland KitchenMarland KitchenMarland Kitchen 5 micrograms [place] two times a day x 1 month.  Orders: Fingerstick (36416) Glucose, (CBG) (85277)  Complete Medication List: 1)  Janumet 50-1000 Mg Tabs (Sitagliptin-metformin hcl) .... Take 1 tablet by mouth two times a day 2)  Neurontin 300 Mg Caps (Gabapentin) .... Take 1  capsule by mouth two times a day 3)  Byetta 5 Mcg Pen 5 Mcg/0.38ml Soln (Exenatide) .... 5 micrograms [place] two times a day x 1 month. 4)  Byetta Pen Needles.  .... 32 guage for the byetta pen for bid dosing. . Prescriptions: BYETTA PEN NEEDLES. 32 guage for the byetta pen for bid dosing. .  #60 x PRN   Entered and Authorized by:   Nani Gasser MD   Signed by:   Nani Gasser MD on 12/28/2009   Method used:   Print then Give to Patient   RxID:   [id number]   Laboratory Results   Blood Tests   Date/Time Received: 12/28/2009 Date/Time Reported:  12/28/2009  Glucose (fasting): 297 mg/dL   (Normal Range: 14-782)        Appended Document: F/U DM   Diabetes Management History:      The patient is a 39 years old female who comes in for evaluation of DM Type 2.  She says that she is exercising.    Diabetes Management Exam:    Eye Exam:       Eye Exam done elsewhere          Date: 01/04/2010          Results: normal          Done by: [facility]

## 2010-11-09 NOTE — Assessment & Plan Note (Signed)
Summary: diabetes, INR, hypertension   Vital Signs:  Patient profile:   40 year old female Height:      70 inches Weight:      281 pounds Pulse rate:   90 / minute BP sitting:   149 / 93  (right arm) Cuff size:   large  Vitals Entered By: Avon Gully CMA, Duncan Dull) (September 10, 2010 8:21 AM) CC: f/u DM and INR   Primary Care Jeevan Kalla:  Nani Gasser MD  CC:  f/u DM and INR.  History of Present Illness: she is here to follow-up her diabetes and check her INR today.  She has been using her Lantus regularly.  She has not checked her blood sugars in 3 weeks.  In her last A1c was 14  Anticoagulation Management History:      The patient is on coumadin and comes in today for a routine follow up visit.  Coumadin therapy is being given due to the first episode of deep venous thrombosis and/or pulmonary embolism.  Anticipated length of treatment is 3-6 months.  Her last INR was 2.3 and today's INR is 1.9.    Diabetes Management History:      The patient is a 40 years old female who comes in for evaluation of DM Type 2.  She states understanding of dietary principles and is following her diet appropriately.  No sensory loss is reported.  She is not checking home blood sugars.  She says that she is exercising.        Hypoglycemic symptoms are not occurring.  No hyperglycemic symptoms are reported.  Other comments include: she notes her polyuria and polydipsia have resolved.        No changes have been made to her treatment plan since last visit.    Current Medications (verified): 1)  Neurontin 300 Mg Caps (Gabapentin) .... Take 1 Tablet By Mouth Three Times A Day 2)  Lantus Solostar 100 Unit/ml Soln (Insulin Glargine) .... 80 Unit [place] Daily 3)  Bd Ultra-Fine Needles .... Use As Directed 4)  Metformin Hcl 1000 Mg Tabs (Metformin Hcl) .... One Tablet By Mouth Two Times A Day 5)  Hydrocodone-Acetaminophen 5-325 Mg Tabs (Hydrocodone-Acetaminophen) .Marland Kitchen.. 1-2 Tabs By Mouth Every 6 Hours As  Needed . 6)  Coumadin 5 Mg Tabs (Warfarin Sodium) .... Sunday - 5 Mg, Monday - 5 Mg, Tuesday - 5 Mg, Wednesday - 5 Mg, Thursday - 5 Mg, Friday - 5 Mg, Saturday - 5 Mg  Allergies (verified): No Known Drug Allergies  Comments:  Nurse/Medical Assistant: The patient's medications and allergies were reviewed with the patient and were updated in the Medication and Allergy Lists. Avon Gully CMA, Duncan Dull) (September 10, 2010 8:22 AM)  Physical Exam  General:  Well-developed,well-nourished,in no acute distress; alert,appropriate and cooperative throughout examination Head:  Normocephalic and atraumatic without obvious abnormalities. No apparent alopecia or balding. Lungs:  Normal respiratory effort, chest expands symmetrically. Lungs are clear to auscultation, no crackles or wheezes. Heart:  Normal rate and regular rhythm. S1 and S2 normal without gallop, murmur, click, rub or other extra sounds. Skin:  no rashes.     Impression & Recommendations:  Problem # 1:  DIABETES MELLITUS, TYPE II (ICD-250.00) her A1c is improved today.  She is still clearly not at goal.  We discussed adding a mealtime insulin.  Will start with a peak.  I gave her a handout with a sliding scale to use.  Two skull she has any questions.  Phlebitis in  her back in one month and see as she is doing Her updated medication list for this problem includes:    Lantus Solostar 100 Unit/ml Soln (Insulin glargine) .Marland KitchenMarland KitchenMarland KitchenMarland Kitchen 80 unit [place] daily    Metformin Hcl 1000 Mg Tabs (Metformin hcl) ..... One tablet by mouth two times a day    Apidra Solostar 100 Unit/ml Soln (Insulin glulisine) .Marland Kitchen... Per ssi.    Lisinopril-hydrochlorothiazide 20-25 Mg Tabs (Lisinopril-hydrochlorothiazide) .Marland Kitchen... 1/2 tab by mouth dialy for one week, then inc to whole tab daily  Orders: Fingerstick (16109) Hemoglobin A1C (60454)  Problem # 2:  COUMADIN THERAPY (ICD-V58.61) please see her adjusted INR therapy. Orders: Protime INR (09811)  Problem # 3:   HYPERTENSION, BENIGN (ICD-401.1) Assessment: New her blood pressure has been elevated on her last couple of visits here.  We discussed treatment with oral medications.  Posterior placental HCT.  She can start with half a tablet first week and he increased will hole to decrease her potential side effects.  We did discuss potential side effects.recheck in one month Her updated medication list for this problem includes:    Lisinopril-hydrochlorothiazide 20-25 Mg Tabs (Lisinopril-hydrochlorothiazide) .Marland Kitchen... 1/2 tab by mouth dialy for one week, then inc to whole tab daily  Complete Medication List: 1)  Neurontin 300 Mg Caps (Gabapentin) .... Take 1 tablet by mouth three times a day 2)  Lantus Solostar 100 Unit/ml Soln (Insulin glargine) .... 80 unit [place] daily 3)  Bd Ultra-fine Needles  .... Use as directed 4)  Metformin Hcl 1000 Mg Tabs (Metformin hcl) .... One tablet by mouth two times a day 5)  Hydrocodone-acetaminophen 5-325 Mg Tabs (Hydrocodone-acetaminophen) .Marland Kitchen.. 1-2 tabs by mouth every 6 hours as needed . 6)  Coumadin 5 Mg Tabs (Warfarin sodium) .... Sunday - 5 mg, monday - 5 mg, tuesday - 5 mg, wednesday - 5 mg, thursday - 5 mg, friday - 6 mg, saturday - 5 mg 7)  Coumadin 1 Mg Tabs (Warfarin sodium) .... Sunday - 5 mg, monday - 5 mg, tuesday - 5 mg, wednesday - 5 mg, thursday - 5 mg, friday - 6 mg, saturday - 5 mg 8)  Apidra Solostar 100 Unit/ml Soln (Insulin glulisine) .... Per ssi. 9)  Lisinopril-hydrochlorothiazide 20-25 Mg Tabs (Lisinopril-hydrochlorothiazide) .... 1/2 tab by mouth dialy for one week, then inc to whole tab daily 10)  Insulin Needles and [place] For The Apidra Vials.  .... Inject three times a day  Anticoagulation Management Assessment/Plan:      The target INR is 2.0-3.0.  She is to have a PT/INR in 2 weeks.         Current Anticoagulation Instructions: The patient's dosage of coumadin will be increased.  The new dosage includes: Coumadin 5 mg tabs and Coumadin 1 mg tabs:   Sunday - 5 mg, Monday - 5 mg, Tuesday - 5 mg, Wednesday - 5 mg, Thursday - 5 mg, Friday - 6 mg, Saturday - 5 mg.  Repeat PT/INR in 2 weeks.    Patient Instructions: 1)  Start lisinipril as below for blood pressure.  2)  Follow up in 1 month for diabetes.  Call if you have any questions about the sliding scale.  Prescriptions: INSULIN NEEDLES AND SYRINGLES FOR THE APIDRA VIALS. inject three times a day  #100 x pRN   Entered and Authorized by:   Nani Gasser MD   Signed by:   Nani Gasser MD on 09/10/2010   Method used:   Printed then faxed to .Marland KitchenMarland Kitchen  [facility]  [place] [phone]* (retail)       [address] [place], [place]  [address]       Ph: [phone] or [phone]       Fax: [phone]   RxID:   [id number] LISINOPRIL-HYDROCHLOROTHIAZIDE 20-25 MG TABS (LISINOPRIL-HYDROCHLOROTHIAZIDE) 1/2 tab by mouth dialy for one week, then inc to whole tab daily  #30 x 2   Entered and Authorized by:   Nani Gasser MD   Signed by:   Nani Gasser MD on 09/10/2010   Method used:   Electronically to        [facility] [phone]* (retail)       [address] [place], [place]  [address]       Ph: [phone] or [phone]       Fax: [phone]   RxID:   [id number] COUMADIN 1 MG TABS (WARFARIN SODIUM) Sunday - 5 mg, Monday - 5 mg, Tuesday - 5 mg, Wednesday - 5 mg, Thursday - 5 mg, Friday - 6 mg, Saturday - 5 mg  #30 x 3   Entered and Authorized by:   Catherine Metheney MD   Signed by:   Catherine Metheney MD on 09/10/2010   Method used:   Printed then faxed to ...       [facility]  [address] [id number] ([facility]ail)       [address] [place], [place]  [address]       Ph: [phone] or [phone]       Fax: [phone]   RxID:   [id number]    Orders Added: 1)  Fingerstick [36416] 2)  Protime INR [85610] 3)  Hemoglobin A1C [83036] 4)  Est. Patient Level IV [14431]     Laboratory Results   Blood Tests   Date/Time Received: 09/10/10 Date/Time  Reported: 09/10/10   INR: 1.9   (Normal Range: 0.88-1.12   Therap INR: 2.0-3.5)

## 2010-11-09 NOTE — Assessment & Plan Note (Signed)
Summary: inr - JR  Nurse Visit   Allergies: No Known Drug Allergies Laboratory Results   Blood Tests   Date/Time Received: 07/07/18 Date/Time Reported: 07/07/18   INR: 3.9   (Normal Range: 0.88-1.12   Therap INR: 2.0-3.5)    Orders Added: 1)  Fingerstick [36416] 2)  Protime INR [85610] 3)  Admin 1st Vaccine [90471] 4)  Flu Vaccine 72yrs + [16109] Flu Vaccine Consent Questions     Do you have a history of severe allergic reactions to this vaccine? no    Any prior history of allergic reactions to egg and/or gelatin? no    Do you have a sensitivity to the preservative Thimersol? no    Do you have a past history of Guillan-Barre Syndrome? no    Do you currently have an acute febrile illness? no    Have you ever had a severe reaction to latex? no    Vaccine information given and explained to patient? yes    Are you currently pregnant? no    Lot Number:[id number]   Exp Date:04/09/2011   Site Given  Left Deltoid IM.lbflu   Anticoagulation Management History:      The patient is on coumadin and comes in today for a routine follow up visit.  She is being anticoagulated because of the first episode of deep venous thrombosis and/or pulmonary embolism.  Anticipated length of treatment is 3-6 months.  Her last INR was 4.0 and today's INR is 3.9.    Anticoagulation Management Assessment/Plan:      The target INR is 2.0-3.0.  She is to have a PT/INR in 2 weeks.         Current Anticoagulation Instructions: Hold dose tonight.  The patient's dosage of coumadin will be decreased.  The new dosage includes: Coumadin 5 mg tabs and Coumadin 1 mg tabs:  Sunday - 5 mg, Monday - 5 mg, Tuesday - 7.5 mg, Wednesday - 5 mg, Thursday - 5 mg, Friday - 5 mg, Saturday - 7.5 mg.    Repeat PT/INR in 2 weeks.

## 2010-11-09 NOTE — Assessment & Plan Note (Signed)
Summary: INR  Nurse Visit   Allergies: No Known Drug Allergies Laboratory Results   Blood Tests   Date/Time Received: 08/06/10 Date/Time Reported: 08/06/10   INR: 2.3   (Normal Range: 0.88-1.12   Therap INR: 2.0-3.5)    Orders Added: 1)  Fingerstick [36416] 2)  Protime INR [85610]   Anticoagulation Management History:      The patient is on coumadin and comes in today for a routine follow up visit.  She is being anticoagulated because of the first episode of deep venous thrombosis and/or pulmonary embolism.  Anticipated length of treatment is 3-6 months.  Her last INR was 3.7 and today's INR is 2.3.    Anticoagulation Management Assessment/Plan:      The target INR is 2.0-3.0.  She is to have a PT/INR in 2 weeks.         Current Anticoagulation Instructions: The patient is to continue with the same dose of coumadin.  This dosage includes: Coumadin 5 mg tabs:  Sunday - 5 mg, Monday - 5 mg, Tuesday - 5 mg, Wednesday - 5 mg, Thursday - 5 mg, Friday - 5 mg, Saturday - 5 mg.  Repeat PT/INR in 2 weeks.

## 2010-11-09 NOTE — Letter (Signed)
Summary: [facility]  [facility]   Imported By: Lanelle Bal 02/04/2010 09:57:05  _____________________________________________________________________  External Attachment:    Type:   Image     Comment:   External Document

## 2010-11-09 NOTE — Progress Notes (Signed)
----   Converted from flag ---- ---- 08/18/2010 7:51 AM, Nani Gasser MD wrote: Call pt: Way overdue for F/U for her Diabetes. ------------------------------  110/09/11 4:09 acm called and pt states she is commin in friday and she will schedule appt then

## 2010-11-09 NOTE — Assessment & Plan Note (Signed)
Summary: MED REFILL//VGJ helen left message to call back to rsc   Vital Signs:  Patient profile:   40 year old female Height:      70 inches Weight:      276 pounds BMI:     39.75 O2 Sat:      97 % on Room air Pulse rate:   103 / minute BP sitting:   134 / 90  (left arm) Cuff size:   large  Vitals Entered By: Payton Spark CMA (April 15, 2010 3:21 PM)  O2 Flow:  Room air CC: F/U DM. Refill meds.   Primary Care Provider:  Nani Gasser MD  CC:  F/U DM. Refill meds..  History of Present Illness: [name] is a 40 year old female who presents today for follow up of her diabetes.  Notes fasting sugars as low as 250,  ranges anywhere between 250 and 350 post prandial.  Denies symptomatic hypoglycemia.  Last eye exam during the spring.  Has not see podiatrist- but is willing to go.  Diet is slowly improving.  She has been diabetic for 6 years.  Current Medications (verified): 1)  Janumet 50-1000 Mg Tabs (Sitagliptin-Metformin Hcl) .... Take 1 Tablet By Mouth Two Times A Day 2)  Neurontin 300 Mg Caps (Gabapentin) .... Take 1 Tablet By Mouth Three Times A Day 3)  Byetta 10 Mcg Pen 10 Mcg/0.42ml Soln (Exenatide) .Marland Kitchen.. 10 Micrograms [place] Two Times A Day 4)  Byetta Pen Needles. .... 32 Guage For The Byetta Pen For Bid Dosing. .  Allergies (verified): No Known Drug Allergies  Physical Exam  General:  Morbidly obese white female awake, alert, and NAD Head:  Normocephalic and atraumatic without obvious abnormalities. No apparent alopecia or balding. Lungs:  Normal respiratory effort, chest expands symmetrically. Lungs are clear to auscultation, no crackles or wheezes. Heart:  Normal rate and regular rhythm. S1 and S2 normal without gallop, murmur, click, rub or other extra sounds.   Impression & Recommendations:  Problem # 1:  DIABETES MELLITUS, TYPE II (ICD-250.00) Assessment Improved A1C is down to 10 from 14 which is improved, however she is still far from the goal of 7.  We  have maxed out the non-insulin options.  She is agreeable to start insulin.  Will D/C januvia and byettta, plan to continue metformin and will initiate Lantus at bedtime with plans to titrate upward 1unit every night until she sees [place] back in 2 weeks. The following medications were removed from the medication list:    Janumet 50-1000 Mg Tabs (Sitagliptin-metformin hcl) .Marland Kitchen... Take 1 tablet by mouth two times a day Her updated medication list for this problem includes:    Byetta 10 Mcg Pen 10 Mcg/0.72ml Soln (Exenatide) .Marland KitchenMarland KitchenMarland KitchenMarland Kitchen 10 micrograms [place] two times a day    Lantus Solostar 100 Unit/ml Soln (Insulin glargine) ..... Start 10 units subcutaneously at bedtime    Metformin Hcl 1000 Mg Tabs (Metformin hcl) ..... One tablet by mouth two times a day  Orders: Fingerstick (36416) Hemoglobin A1C (83036) Podiatry Referral (Podiatry)  Complete Medication List: 1)  Neurontin 300 Mg Caps (Gabapentin) .... Take 1 tablet by mouth three times a day 2)  Byetta 10 Mcg Pen 10 Mcg/0.29ml Soln (Exenatide) .Marland Kitchen.. 10 micrograms [place] two times a day 3)  Lantus Solostar 100 Unit/ml Soln (Insulin glargine) .... Start 10 units subcutaneously at bedtime 4)  Bd Ultra-fine Needles  .... Use as directed 5)  Metformin Hcl 1000 Mg Tabs (Metformin hcl) .... One tablet  by mouth two times a day  Patient Instructions: 1)  Stop Byetta and Janumet- start metformin and lantus 10 units subcutaneously at bedtime.  Increase by 1 unit each night until you reach a fasting AM glucose of 100.  Do not go higher than 25 units before you see [place] again. 2)  Follow up in 2 weeks. Prescriptions: METFORMIN HCL 1000 MG TABS (METFORMIN HCL) one tablet by mouth two times a day  #60 x 2   Entered and Authorized by:   Lemont Fillers FNP   Signed by:   Lemont Fillers FNP on 04/15/2010   Method used:   Print then Give to Patient   RxID:   [id number] ULTRA-FINE NEEDLES use as directed  #100 x 1   Entered and Authorized by:   Lemont Fillers FNP   Signed by:   Lemont Fillers FNP on 04/15/2010   Method used:   Print then Give to Patient   RxID:   [id number] LANTUS SOLOSTAR 100 UNIT/ML SOLN (INSULIN GLARGINE) start 10 units subcutaneously at bedtime  #1 x 0   Entered and Authorized by:   Lemont Fillers FNP   Signed by:   Lemont Fillers FNP on 04/15/2010   Method used:   Electronically to        [facility] [phone]* (retail)       [address] [place], [place]  [address]       Ph: [phone]       Fax: [phone]   RxID:   [id number]   Laboratory Results   Blood Tests     HGBA1C: 10.9%   (Normal Range: Non-Diabetic - 3-6%   Control Diabetic - 6-8%)

## 2010-11-11 NOTE — Assessment & Plan Note (Signed)
Summary: Coumadin check   Nurse Visit   Vitals Entered By: Payton Spark CMA (October 21, 2010 3:35 PM)  Allergies: No Known Drug Allergies Laboratory Results   Blood Tests      INR: 1.5   (Normal Range: 0.88-1.12   Therap INR: 2.0-3.5)    Orders Added: 1)  Fingerstick [36416] 2)  Protime INR [14782]   Anticoagulation Management History:      The patient is on coumadin and comes in today for a routine follow up visit.  Coumadin therapy is being given due to the first episode of deep venous thrombosis and/or pulmonary embolism.  Anticipated length of treatment is 3-6 months.  Her last INR was 1.9 and today's INR is 1.5.     Complete Medication List: 1)  Neurontin 300 Mg Caps (Gabapentin) .... Take 1 tablet by mouth three times a day 2)  Lantus Solostar 100 Unit/ml Soln (Insulin glargine) .... 80 unit [place] daily 3)  Bd Ultra-fine Needles  .... Use as directed 4)  Metformin Hcl 1000 Mg Tabs (Metformin hcl) .... One tablet by mouth two times a day 5)  Hydrocodone-acetaminophen 5-325 Mg Tabs (Hydrocodone-acetaminophen) .Marland Kitchen.. 1-2 tabs by mouth every 6 hours as needed . 6)  Apidra Solostar 100 Unit/ml Soln (Insulin glulisine) .... Give dose with each meal per ssi. up to 60 units daily. 7)  Lisinopril-hydrochlorothiazide 20-25 Mg Tabs (Lisinopril-hydrochlorothiazide) .... 1/2 tab by mouth dialy for one week, then inc to whole tab daily 8)  Insulin Needles and [place]ring[place] For The Apidra Vials.  .... Inject three times a day 9)  Cephalexin 500 Mg Tabs (Cephalexin) .... One by mouth q6hr 10)  Strips For The Accucheck Machine.  .... Tests 4 x a day dx 250.02 11)  Coumadin 5 Mg Tabs (Warfarin sodium) .... Sunday - 6 mg, monday - 5 mg, tuesday - 5 mg, wednesday - 5 mg, thursday - 6 mg, friday - 5 mg, saturday - 5 mg 12)  Coumadin 1 Mg Tabs (Warfarin sodium) .... Sunday - 6 mg, monday - 5 mg, tuesday - 5 mg, wednesday - 5 mg, thursday - 6 mg, friday - 5 mg, saturday - 5 mg  Other  Orders: Fingerstick (95621) Protime INR (30865)  Anticoagulation Management Assessment/Plan:      The target INR is 2.0-3.0.  She is to have a PT/INR in 1 week.  Anticoagulation instructions were given to patient.         Current Anticoagulation Instructions: The patient's dosage of coumadin will be increased.  The new dosage includes: Coumadin 5 mg tabs and Coumadin 1 mg tabs:  Sunday - 6 mg, Monday - 5 mg, Tuesday - 5 mg, Wednesday - 5 mg, Thursday - 6 mg, Friday - 5 mg, Saturday - 5 mg.  Repeat PT/INR in 1 week.

## 2010-11-11 NOTE — Progress Notes (Signed)
Summary: refills  Phone Note Refill Request   Refills Requested: Medication #1:  BD ULTRA-FINE NEEDLES use as directed   Supply Requested: 1 month   [facility] [address] [facility] [place]  Medication #2:  APIDRA SOLOSTAR 100 UNIT/ML SOLN Per SSI.   Supply Requested: 1 month Pt also needs test strips for her glucometer- has the [employer] machine   Method Requested: Fax to Local Pharmacy Initial call taken by: Kathlene November LPN,  October 05, 2010 8:59 AM    New/Updated Medications: APIDRA SOLOSTAR 100 UNIT/ML SOLN (INSULIN GLULISINE) Give dose with each meal per SSI. Up to 60 units daily. * STRIPS FOR THE ACCUCHECK MACHINE. Tests 4 x a day Dx 250.02 Prescriptions: APIDRA SOLOSTAR 100 UNIT/ML SOLN (INSULIN GLULISINE) Give dose with each meal per SSI. Up to 60 units daily.  #1 box. x 11   Entered and Authorized by:   Nani Gasser MD   Signed by:   Nani Gasser MD on 10/05/2010   Method used:   Printed then faxed to ...       [address] [phone]* (retail)       [address] [place], [place]  [address]       Ph: [phone]       Fax: [phone]   RxID:   [id number] STRIPS FOR THE ACCUCHECK MACHINE. Tests 4 x a day Dx 250.02  #120 x 11   Entered and Authorized by:   Nani Gasser MD   Signed by:   Nani Gasser MD on 10/05/2010   Method used:   Printed then faxed to ...       [address] [phone]* (retail)       [address] [place], [place]  [address]       Ph: [phone]       Fax: [phone]   RxID:   [id number] BD ULTRA-FINE NEEDLES use as directed  #100 x 4   Entered and Authorized by:   Nani Gasser MD   Signed by:   Nani Gasser MD on 10/05/2010   Method used:   Printed then faxed to ...       [address] [phone]* (retail)       [address] [place], [place]  [address]       Ph: [phone]       Fax: [phone]   RxID:   [id number]

## 2010-11-11 NOTE — Progress Notes (Signed)
  Phone Note Outgoing Call   Call placed by: Clemens Catholic LPN,  September 20, 2010 12:53 PM Call placed to: Patient Summary of Call: call back: called  and left pt a message to call back for lab results. Initial call taken by: Clemens Catholic LPN,  September 20, 2010 12:53 PM     Appended Document:  spoke to pt, given results. she states that her wound is better./C.Locklear,LPN

## 2010-11-11 NOTE — Assessment & Plan Note (Signed)
Summary: INR  Nurse Visit   Allergies: No Known Drug Allergies Laboratory Results   Blood Tests   Date/Time Received: 10/28/10 Date/Time Reported: 10/28/10   INR: 1.9   (Normal Range: 0.88-1.12   Therap INR: 2.0-3.5)    Orders Added: 1)  Fingerstick [36416] 2)  Protime INR [85610]   Anticoagulation Management History:      The patient is on coumadin and comes in today for a routine follow up visit.  Anticoagulation is being administered due to the first episode of deep venous thrombosis and/or pulmonary embolism.  Anticipated length of treatment is 3-6 months.  Her last INR was 1.5 and today's INR is 1.9.    Anticoagulation Management Assessment/Plan:      The target INR is 2.0-3.0.  She is to have a PT/INR in 2 weeks.  Anticoagulation instructions were given to patient.         Current Anticoagulation Instructions: The patient's dosage of coumadin will be increased.  The new dosage includes: For today take 7mg .  Coumadin 5 mg tabs and Coumadin 1 mg tabs:  Sunday - 6 mg, Monday - 5 mg, Tuesday - 6 mg, Wednesday - 5 mg, Thursday - 6 mg, Friday - 5 mg, Saturday - 5 mg.  Repeat PT/INR in 2 weeks.

## 2010-11-17 NOTE — Progress Notes (Signed)
Summary: Apridra backorder  Phone Note Call from Patient   Caller: Patient Call For: Nani Gasser MD Reason for Call: Refill Medication Summary of Call: [facility] Apr[facility]ra is on national backorder.  Pt would like to be switched to Novolog if possibleas it is cheaper on insurance.  Pt would like vials instead of pens. Initial call taken by: Francee Piccolo CMA Duncan Dull),  November 08, 2010 3:07 PM  Follow-up for Phone Call        let her know that apidra is doing some program where if you switch to vials unti lthey fix their manufacturing problem with the pens then your copay is waived and med is essentially freee. We have the cards you activate adn take to the pharmacy.  Follow-up by: Nani Gasser MD,  November 08, 2010 4:47 PM  Additional Follow-up for Phone Call Additional follow up Details #1::        [facility] informing Pt of the above Additional Follow-up by: Payton Spark CMA,  November 09, 2010 9:55 AM     Appended Document: Apridra backorder pt is agreeable to vials of Apidra.  [facility].  Please send rx for vials. Francee Piccolo CMA Duncan Dull)  November 09, 2010 4:22 PM    Prescriptions: APIDRA 100 UNIT/ML SOLN (INSULIN GLULISINE) Give dose with each meal per SSI. Up to 60 units daily.  #30 days supp x 3   Entered and Authorized by:   Nani Gasser MD   Signed by:   Nani Gasser MD on 11/09/2010   Method used:   [facility]        [facility] [phone][facility] (retail)       [address] [place], [place]  [address]       Ph: [phone] or [phone]       Fax: [phone]   RxID:   [id number]

## 2010-12-07 NOTE — Letter (Signed)
Summary: [facility]/[place]  Care Considerations/ [facility]   Imported By: Maryln Gottron 11/29/2010 13:05:59  _____________________________________________________________________  External Attachment:    Type:   Image     Comment:   External Document

## 2011-01-13 ENCOUNTER — Encounter: Payer: Self-pay | Admitting: Family Medicine

## 2011-01-18 ENCOUNTER — Encounter: Payer: Self-pay | Admitting: Family Medicine

## 2011-01-19 ENCOUNTER — Ambulatory Visit: Payer: Self-pay | Admitting: Family Medicine

## 2011-02-05 ENCOUNTER — Other Ambulatory Visit: Payer: Self-pay | Admitting: Family Medicine

## 2011-02-21 ENCOUNTER — Telehealth: Payer: Self-pay | Admitting: Family

## 2011-02-21 NOTE — Telephone Encounter (Signed)
Pt has no future appts on file with Dr Linford Arnold. Las seen 09/2010. Was supposed to have returned for follow up in January. Please advise if ok to give refills and if so how many?

## 2011-02-21 NOTE — Telephone Encounter (Signed)
Lets call pt an dmake sure she has sched a diabetic followup.

## 2011-02-21 NOTE — Telephone Encounter (Signed)
Pt notified that she needs to schedule an office visit for diabetes check up.  LMOM cell phone, Jarvis Newcomer, LPN Domingo Dimes

## 2011-02-21 NOTE — Telephone Encounter (Signed)
Refill-metformin hcl 1000mg tablet. Take 1 tablet by mouth twice a day. Qty 60. Last fill 3.31.12 °

## 2011-02-25 ENCOUNTER — Ambulatory Visit (INDEPENDENT_AMBULATORY_CARE_PROVIDER_SITE_OTHER): Payer: BC Managed Care – PPO | Admitting: Family Medicine

## 2011-02-25 DIAGNOSIS — I10 Essential (primary) hypertension: Secondary | ICD-10-CM

## 2011-02-25 DIAGNOSIS — E119 Type 2 diabetes mellitus without complications: Secondary | ICD-10-CM

## 2011-02-25 LAB — POCT GLYCOSYLATED HEMOGLOBIN (HGB A1C): Hemoglobin A1C: 9.3

## 2011-02-25 MED ORDER — FREESTYLE SYSTEM KIT: 1.0000 | PACK | Status: AC | PRN

## 2011-02-25 MED ORDER — INSULIN GLULISINE 100 UNIT/ML IJ SOLN: 18.0000 [IU] | Freq: Three times a day (TID) | INTRAMUSCULAR | Status: DC

## 2011-02-25 MED ORDER — METOPROLOL SUCCINATE ER 25 MG PO TB24: 25.0000 mg | ORAL_TABLET | Freq: Every day | ORAL | Status: DC

## 2011-02-25 NOTE — Assessment & Plan Note (Signed)
Her A1c is still elevated today at 9.3. This is a significant improvement from last time but still not nearly at goal. We discussed increasing her Apidra at 18 units with each meal. If she is doing well and not having any lows and she can try even increasing it to 20 units. She may have to stick with the lower dose of 15-18 units on any of her smaller meals. I did encourage her to start checking her blood sugars a little bit more regularly even if it's not with each meal. We can send her for a new prescription for a glucometer to her pharmacy.

## 2011-02-25 NOTE — Progress Notes (Signed)
  Subjective:    Patient ID: Danielle Hodges, female    DOB: 14-Oct-1970, 40 y.o.   MRN: [id number]  Diabetes She presents for her follow-up diabetic visit. She has type 2 diabetes mellitus. There are no hypoglycemic associated symptoms. There are no hypoglycemic complications. There are no diabetic complications. (Lost her machine. )  15 units with each meal. She is really struggling to lose weight. She has tried has made some progress but still she is at a standstill currently. I now she is having problems with her knees and cannot exercise as regularly as she would like. She has been walking and cycling to her workout classes. She knows that the workup is not nearly as intense and she is not wearing it only as needed calories. She has not been active checking her blood sugars lately.    Review of Systems     Objective:   Physical Exam  Constitutional: She is oriented to person, place, and time. She appears well-developed and well-nourished.       She is obese  HENT:  Head: Normocephalic and atraumatic.  Neck: Neck supple. No thyromegaly present.  Cardiovascular: Normal rate, regular rhythm and normal heart sounds.   Pulmonary/Chest: Effort normal and breath sounds normal.  Lymphadenopathy:    She has no cervical adenopathy.  Neurological: She is alert and oriented to person, place, and time.  Skin: Skin is warm and dry.  Psychiatric: She has a normal mood and affect.          Assessment & Plan:

## 2011-02-25 NOTE — Assessment & Plan Note (Addendum)
Repeat blood pressure measurement was still elevated today. We'll start a low-dose beta blocker in addition to her current regimen. Followup in one month to recheck her blood pressure. We can also check and to see what she has adjusted her insulin 2. She is also due for lab work. I will fax a lab slipfor her to get in the next one or 2 weeks before her followup appointment.

## 2011-02-28 ENCOUNTER — Telehealth: Payer: Self-pay | Admitting: Family Medicine

## 2011-02-28 NOTE — Telephone Encounter (Signed)
Patient's husband walk-in request that patient needs strips for accu-check blood glucose meter Aviva 90 day supply. Patient states can call her home phone if needed.

## 2011-02-28 NOTE — Telephone Encounter (Signed)
Please call these in to the pharm. Thank you.

## 2011-03-02 ENCOUNTER — Other Ambulatory Visit: Payer: Self-pay | Admitting: *Deleted

## 2011-03-02 NOTE — Telephone Encounter (Signed)
Called pharm and called in aviva strips for 90 day supply. Per old chart pt checks bld sugar 4 x a day. So let pharm know this and to fill enough for 90 day supply

## 2011-03-06 ENCOUNTER — Other Ambulatory Visit: Payer: Self-pay | Admitting: Family Medicine

## 2011-03-08 ENCOUNTER — Telehealth: Payer: Self-pay | Admitting: Internal Medicine

## 2011-03-08 NOTE — Telephone Encounter (Signed)
Refill-metformin hcl 1000mg  tablet. Take 1 tablet by mouth twice a day. Qty 60. Last fill 3.31.12

## 2011-03-09 MED ORDER — METFORMIN HCL 1000 MG PO TABS: 1000.0000 mg | ORAL_TABLET | Freq: Two times a day (BID) | ORAL | Status: DC

## 2011-03-22 ENCOUNTER — Telehealth: Payer: Self-pay | Admitting: Family Medicine

## 2011-03-22 NOTE — Telephone Encounter (Signed)
Call pt: Remind her to go for her labs. Has been lamost 4 weeks and I haven't seen the results. Yet. Even her insurance co sent me a letter saying she hasn't gone.

## 2011-03-23 NOTE — Telephone Encounter (Signed)
Pt aware of the above and will go

## 2011-03-25 ENCOUNTER — Telehealth: Payer: Self-pay | Admitting: Family Medicine

## 2011-03-25 LAB — COMPLETE METABOLIC PANEL WITH GFR
BUN: 11 mg/dL (ref 6–23)
CO2: 28 mEq/L (ref 19–32)
Calcium: 9.4 mg/dL (ref 8.4–10.5)
Creat: 0.65 mg/dL (ref 0.50–1.10)
GFR, Est African American: >60 mL/min (ref >60)
GFR, Est Non African American: >60 mL/min (ref >60)
Glucose, Bld: 216 mg/dL — ABNORMAL HIGH (ref 70–99)
Total Bilirubin: 0.4 mg/dL (ref 0.3–1.2)

## 2011-03-25 LAB — LIPID PANEL
HDL: 31 mg/dL — ABNORMAL LOW (ref >39)
Triglycerides: 300 mg/dL — ABNORMAL HIGH (ref <150)

## 2011-03-25 MED ORDER — INSULIN GLARGINE 100 UNIT/ML ~~LOC~~ SOLN: 90.0000 [IU] | Freq: Every day | SUBCUTANEOUS | Status: DC

## 2011-03-25 MED ORDER — FENOFIBRATE 145 MG PO TABS: 145.0000 mg | ORAL_TABLET | Freq: Every day | ORAL | Status: DC

## 2011-03-25 NOTE — Telephone Encounter (Signed)
Her A1c today is 9.3. Her sugar was over 200. Her triglycerides were 300. Please make sure she is using Apidra to 18 units with each meal. Also go ahead and increase Lantus to 90 units. Also we need to add medication to help cover her triglycerides. Her LDL looked great. Please make sure she is a followup appointment in 2 months with me.

## 2011-03-28 ENCOUNTER — Telehealth: Payer: Self-pay | Admitting: Family Medicine

## 2011-03-28 ENCOUNTER — Ambulatory Visit: Payer: BC Managed Care – PPO | Admitting: Family Medicine

## 2011-03-28 NOTE — Telephone Encounter (Signed)
LMOM advising pt of recommendations

## 2011-03-28 NOTE — Telephone Encounter (Signed)
Pt had called earlier and is returning a call to Gaylyn Lambert working for Dr. Linford Arnold.  They changed her insulin and she is wanting to know if she should keep her appt today at 3:30pm or resched for 2 mths from now.  Please advise. Plan:  Routed to Gaylyn Lambert since pt was returning her call. Jarvis Newcomer, LPN Domingo Dimes

## 2011-03-29 NOTE — Telephone Encounter (Signed)
Attempted to return pt call x 2 LMOM to rt call. No rt call. LMOM to just f/u in 2 mo if no further questions

## 2011-04-03 ENCOUNTER — Other Ambulatory Visit: Payer: Self-pay | Admitting: Internal Medicine

## 2011-05-03 ENCOUNTER — Ambulatory Visit: Payer: BC Managed Care – PPO | Admitting: Family Medicine

## 2011-05-03 ENCOUNTER — Encounter: Payer: Self-pay | Admitting: Emergency Medicine

## 2011-05-03 ENCOUNTER — Inpatient Hospital Stay (INDEPENDENT_AMBULATORY_CARE_PROVIDER_SITE_OTHER)
Admission: RE | Admit: 2011-05-03 | Discharge: 2011-05-03 | Disposition: A | Discharge status: Home / Self Care | Payer: BC Managed Care – PPO | Source: Ambulatory Visit | Attending: Emergency Medicine | Admitting: Emergency Medicine

## 2011-05-03 DIAGNOSIS — I10 Essential (primary) hypertension: Secondary | ICD-10-CM | POA: Insufficient documentation

## 2011-05-03 DIAGNOSIS — L02219 Cutaneous abscess of trunk, unspecified: Secondary | ICD-10-CM

## 2011-05-03 DIAGNOSIS — E785 Hyperlipidemia, unspecified: Secondary | ICD-10-CM | POA: Insufficient documentation

## 2011-05-03 DIAGNOSIS — L03319 Cellulitis of trunk, unspecified: Secondary | ICD-10-CM

## 2011-05-04 ENCOUNTER — Telehealth: Payer: Self-pay | Admitting: Family Medicine

## 2011-05-04 MED ORDER — HYDROCODONE-ACETAMINOPHEN 5-325 MG PO TABS: 1.0000 | ORAL_TABLET | ORAL | Status: AC | PRN

## 2011-05-04 NOTE — Telephone Encounter (Signed)
Rx called as ordered [facility]  Pt notified. Jarvis Newcomer, LPN Domingo Dimes

## 2011-05-04 NOTE — Telephone Encounter (Signed)
Pt called and left mess on triage VM requesting a pain medication for the pain for an abscess she was treated and diagnosed for yest in the UC.  We had no appts yest since Dr. Clenton Pare not in office.  Lots of pressure since located on LT lower area of abdomen.  Went to work and had to leave because the pain was too much for her.  Treated with doxy X 10 days, and sulfa for 10 days.  Has tried aleve, ibuprofen and they are not working.  Constant pressure.  Pain level # 7.   Plan:  Routed to Dr. Linford Arnold for [facility]. Jarvis Newcomer, LPN Domingo Dimes

## 2011-05-04 NOTE — Telephone Encounter (Signed)
Ok for hydrocodone. i putin order. Please call in since there I am not there.

## 2011-05-06 ENCOUNTER — Telehealth (INDEPENDENT_AMBULATORY_CARE_PROVIDER_SITE_OTHER): Payer: Self-pay | Admitting: Emergency Medicine

## 2011-05-13 ENCOUNTER — Other Ambulatory Visit: Payer: Self-pay | Admitting: Family Medicine

## 2011-05-17 ENCOUNTER — Other Ambulatory Visit: Payer: Self-pay | Admitting: Family Medicine

## 2011-05-17 ENCOUNTER — Telehealth: Payer: Self-pay | Admitting: Family Medicine

## 2011-05-17 NOTE — Telephone Encounter (Signed)
Pt is calling for refill of her gabapentin and wants to know why it was rejected to refill, Plan:  Looked back and the refills on this med should be good through 10/2011.  Reported this to the pt and told her if pharm doesn't have to tell them to call the triage nurse our office and we can give a verbal order. Jarvis Newcomer, LPN Domingo Dimes

## 2011-05-17 NOTE — Telephone Encounter (Signed)
Pharmacy called to confirm pt neurontin prescription.  They are statin they do not have the script from 02-05-11.  Gave a verbal. Jarvis Newcomer, LPN Domingo Dimes

## 2011-05-20 ENCOUNTER — Other Ambulatory Visit: Payer: Self-pay | Admitting: Family Medicine

## 2011-05-28 ENCOUNTER — Other Ambulatory Visit: Payer: Self-pay | Admitting: Emergency Medicine

## 2011-05-28 ENCOUNTER — Ambulatory Visit
Admission: RE | Admit: 2011-05-28 | Discharge: 2011-05-28 | Disposition: A | Discharge status: Home / Self Care | Payer: BC Managed Care – PPO | Source: Ambulatory Visit | Attending: Emergency Medicine | Admitting: Emergency Medicine

## 2011-05-28 ENCOUNTER — Encounter: Payer: Self-pay | Admitting: Emergency Medicine

## 2011-05-28 ENCOUNTER — Inpatient Hospital Stay (INDEPENDENT_AMBULATORY_CARE_PROVIDER_SITE_OTHER)
Admission: RE | Admit: 2011-05-28 | Discharge: 2011-05-28 | Disposition: A | Discharge status: Home / Self Care | Payer: BC Managed Care – PPO | Source: Ambulatory Visit | Attending: Emergency Medicine | Admitting: Emergency Medicine

## 2011-05-28 DIAGNOSIS — M25529 Pain in unspecified elbow: Secondary | ICD-10-CM | POA: Insufficient documentation

## 2011-05-28 DIAGNOSIS — M25539 Pain in unspecified wrist: Secondary | ICD-10-CM

## 2011-05-29 ENCOUNTER — Other Ambulatory Visit: Payer: Self-pay | Admitting: Family Medicine

## 2011-05-31 ENCOUNTER — Other Ambulatory Visit: Payer: Self-pay | Admitting: Family Medicine

## 2011-05-31 ENCOUNTER — Other Ambulatory Visit: Payer: Self-pay | Admitting: Internal Medicine

## 2011-06-02 ENCOUNTER — Encounter: Payer: Self-pay | Admitting: Family Medicine

## 2011-06-02 ENCOUNTER — Ambulatory Visit (INDEPENDENT_AMBULATORY_CARE_PROVIDER_SITE_OTHER): Payer: BC Managed Care – PPO | Admitting: Family Medicine

## 2011-06-02 DIAGNOSIS — Z23 Encounter for immunization: Secondary | ICD-10-CM

## 2011-06-02 DIAGNOSIS — I10 Essential (primary) hypertension: Secondary | ICD-10-CM

## 2011-06-02 DIAGNOSIS — E119 Type 2 diabetes mellitus without complications: Secondary | ICD-10-CM

## 2011-06-02 LAB — POCT UA - MICROALBUMIN

## 2011-06-02 NOTE — Assessment & Plan Note (Signed)
I congratulated her on her weight loss. A1C is improved by about a point.  Increase evening meal insulin to 25 units and titrate up until AM sugar is better.  Continue Lantus  90 units. Due for eye exam this winter. Microalb done today. Flu shot given today.  Lab Results  Component Value Date   HGBA1C 8.4 06/02/2011

## 2011-06-02 NOTE — Progress Notes (Signed)
  Subjective:    Patient ID: Danielle Hodges, female    DOB: 1971-07-19, 40 y.o.   MRN: [id number]  Diabetes She presents for her follow-up diabetic visit. She has type 2 diabetes mellitus. Her disease course has been improving. There are no hypoglycemic associated symptoms. Pertinent negatives for diabetes include no chest pain, no polydipsia and no polyphagia. There are no hypoglycemic complications. There are no diabetic complications. She is compliant with treatment all of the time. She is following a generally healthy diet. She has not had a previous visit with a dietician. She participates in exercise intermittently. Her home blood glucose trend is decreasing steadily. Her breakfast blood glucose range is generally >200 mg/dl. Her dinner blood glucose is taken after 8 pm. Her dinner blood glucose range is generally 180-200 mg/dl. An ACE inhibitor/angiotensin II receptor blocker is being taken. Eye exam is current.  Hypertension This is a chronic problem. The current episode started more than 1 year ago. The problem has been gradually improving since onset. Pertinent negatives include no chest pain or shortness of breath. There are no associated agents to hypertension. Risk factors for coronary artery disease include diabetes mellitus and dyslipidemia. Past treatments include ACE inhibitors and diuretics. There are no compliance problems.    She has also lost weight.    Review of Systems  Respiratory: Negative for shortness of breath.   Cardiovascular: Negative for chest pain.  Hematological: Negative for polydipsia and polyphagia.       Objective:   Physical Exam  Constitutional: She is oriented to person, place, and time. She appears well-developed and well-nourished.  HENT:  Head: Normocephalic and atraumatic.  Cardiovascular: Normal rate, regular rhythm and normal heart sounds.   Pulmonary/Chest: Effort normal and breath sounds normal.  Neurological: She is alert and oriented to  person, place, and time.  Skin: Skin is warm and dry.  Psychiatric: She has a normal mood and affect. Her behavior is normal.          Assessment & Plan:

## 2011-06-02 NOTE — Assessment & Plan Note (Signed)
BP looks great today. At goal. Keep up the weight loss.

## 2011-06-02 NOTE — Patient Instructions (Signed)
Increase nightime medication to 25 units (apidra)

## 2011-06-14 ENCOUNTER — Telehealth: Payer: Self-pay | Admitting: Family Medicine

## 2011-06-14 MED ORDER — LOSARTAN POTASSIUM-HCTZ 100-25 MG PO TABS: 1.0000 | ORAL_TABLET | Freq: Every day | ORAL | Status: DC

## 2011-06-14 NOTE — Telephone Encounter (Signed)
Pt called and left mess on triage voice mail stating she is currently on lisinopril HCTZ and has been experiencing a bad cough to the point she cannot sleep at night.  Pt aware that the cough could be a side effect of this medication.  Please advise. Plan:  Routed this call to the provider. Jarvis Newcomer, LPN Domingo Dimes

## 2011-06-14 NOTE — Telephone Encounter (Signed)
Pt notified of MD instructions. KJ LPN 

## 2011-06-14 NOTE — Telephone Encounter (Signed)
Will change to ARB. New rx sent to pharm.  May take a 2-4 weeks for cough to resolve if from her meds.

## 2011-06-20 ENCOUNTER — Telehealth: Payer: Self-pay | Admitting: Family Medicine

## 2011-06-20 NOTE — Telephone Encounter (Signed)
Closed

## 2011-06-23 ENCOUNTER — Other Ambulatory Visit: Payer: Self-pay | Admitting: Family Medicine

## 2011-06-23 NOTE — Telephone Encounter (Signed)
Pt needs to call office and arrange to do fasting labs for chol which would be a 2 month follow up.  Triglycerides were high 2 months ago.

## 2011-07-06 ENCOUNTER — Other Ambulatory Visit: Payer: Self-pay | Admitting: Family Medicine

## 2011-07-23 ENCOUNTER — Other Ambulatory Visit: Payer: Self-pay | Admitting: Family Medicine

## 2011-07-25 ENCOUNTER — Other Ambulatory Visit: Payer: Self-pay | Admitting: Family Medicine

## 2011-07-30 ENCOUNTER — Other Ambulatory Visit: Payer: Self-pay | Admitting: Family Medicine

## 2011-08-07 ENCOUNTER — Other Ambulatory Visit: Payer: Self-pay | Admitting: Family Medicine

## 2011-09-06 ENCOUNTER — Other Ambulatory Visit: Payer: Self-pay | Admitting: Family Medicine

## 2011-09-12 NOTE — Telephone Encounter (Signed)
  Phone Note Outgoing Call Call back at Home Phone [phone] [facility]     Call placed by: Emilio Math,  May 06, 2011 2:03 PM Call placed to: Patient Summary of Call: Spoke with patient the abscess opened up today and is draining, so it's much better.

## 2011-09-12 NOTE — Progress Notes (Signed)
Summary: hand injury/TM (room 4)   Vital Signs:  Patient Profile:   40 Years Old Female CC:      sudden onset pain left wrist/lower arm (no injury) Height:     70 inches Weight:      286 pounds O2 Sat:      98 % O2 treatment:    Room Air Temp:     97.8 degrees F oral Pulse rate:   105 / minute Resp:     16 per minute BP sitting:   119 / 78  (left arm) Cuff size:   large  Pt. in pain?   yes    Location:   left wrist/lower arm    Intensity:   8  Vitals Entered By: Lavell Islam RN (May 28, 2011 3:08 PM)                   Updated Prior Medication List: NEURONTIN 300 MG CAPS (GABAPENTIN) Take 1 tablet by mouth three times a day LANTUS SOLOSTAR 100 UNIT/ML SOLN (INSULIN GLARGINE) 80 unit  daily * BD ULTRA-FINE NEEDLES use as directed METFORMIN HCL 1000 MG TABS (METFORMIN HCL) one tablet by mouth two times a day APIDRA 100 UNIT/ML SOLN (INSULIN GLULISINE) Give dose with each meal per SSI. Up to 60 units daily. LISINOPRIL-HYDROCHLOROTHIAZIDE 20-25 MG TABS (LISINOPRIL-HYDROCHLOROTHIAZIDE) 1/2 tab by mouth dialy for one week, then inc to whole tab daily * INSULIN NEEDLES AND SYRINGLES FOR THE APIDRA VIALS. inject three times a day * STRIPS FOR THE ACCUCHECK MACHINE. Tests 4 x a day Dx 250.02 TRICOR 145 MG TABS (FENOFIBRATE)  BACTRIM DS 800-160 MG TABS (SULFAMETHOXAZOLE-TRIMETHOPRIM) 1 by mouth two times a day for 10 days DOXYCYCLINE HYCLATE 100 MG CAPS (DOXYCYCLINE HYCLATE) 1 by mouth two times a day for 10 days  Current Allergies: No known allergies History of Present Illness History from: patient Chief Complaint: sudden onset pain left wrist/lower arm (no injury) History of Present Illness: Sudden onset L wrist/elbow/arm pain this morning.  No known injury.  No recent lifting, accidents, etc.  Pain feels like it starts in the elbow and moves to her wrist.  SHe has a history of L forearm/wrist surgery about 15 years ago and has a plate & screws.  She feels soreness and  sharp pain.  Hasn't used ice or meds.    REVIEW OF SYSTEMS Constitutional Symptoms      Denies fever, chills, night sweats, weight loss, weight gain, and fatigue.  Eyes       Denies change in vision, eye pain, eye discharge, glasses, contact lenses, and eye surgery. Ear/Nose/Throat/Mouth       Denies hearing loss/aids, change in hearing, ear pain, ear discharge, dizziness, frequent runny nose, frequent nose bleeds, sinus problems, sore throat, hoarseness, and tooth pain or bleeding.  Respiratory       Denies dry cough, productive cough, wheezing, shortness of breath, asthma, bronchitis, and emphysema/COPD.  Cardiovascular       Denies murmurs, chest pain, and tires easily with exhertion.    Gastrointestinal       Denies stomach pain, nausea/vomiting, diarrhea, constipation, blood in bowel movements, and indigestion. Genitourniary       Denies painful urination, kidney stones, and loss of urinary control. Neurological       Denies paralysis, seizures, and fainting/blackouts. Musculoskeletal       Complains of muscle pain, joint pain, and decreased range of motion.      Denies joint stiffness, redness, swelling, muscle weakness, and  gout.  Skin       Denies bruising, unusual mles/lumps or sores, and hair/skin or nail changes.  Psych       Denies mood changes, temper/anger issues, anxiety/stress, speech problems, depression, and sleep problems. Other Comments: sudden onset pain left wrist/lower arm (previous surgery>plate/screws)   Past History:  Past Surgical History: Last updated: 12/08/2009 R open wrist reduction Ovarian cyst removed 2001 Uterine ablation 2008  Family History: Last updated: 12/08/2009 Family History Hypertension Father with esophageal Cancer, HTN Mother with DM.    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 married to [place] with one child.  Never Smoked Alcohol use-no Drug use-no Regular  exercise-Yes, jazzercise 6 days a week.   Past Medical History: Reviewed history from 05/03/2011 and no changes required. Diabetes mellitus, type II Hyperlipidemia Hypertension  Family History: Reviewed history from 12/08/2009 and no changes required. Family History Hypertension Father with esophageal Cancer, HTN Mother with DM.    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  Never Smoked Alcohol use-no Drug use-no Regular exercise-Yes, jazzercise 6 days a week.  Physical Exam General appearance: well developed, well nourished, no acute distress MSE: oriented to time, place, and person L elbow: FROM, TTP medial elbow and + Tinels at ulnar nerve.  L wrist: FROM (full for her which is reduced secondary to hold injury).  Resisted wrist flexion is painful.  No swelling, no ecchymoses.  Mildly TTP ulnar aspect of wrist.  No snuffbox tenderness. Assessment New Problems: WRIST PAIN (ICD-719.43) ELBOW PAIN (ICD-719.42)   Plan New Medications/Changes: TRAMADOL HCL 50 MG TABS (TRAMADOL HCL) 1 by mouth q6 hrs as needed for pain  #20 x 0, 05/28/2011, Hoyt Koch MD  New Orders: Est. Patient Level IV [40981] T-DG Elbow Complete*L* [73080] T-DG Wrist Complete*L* [73110] Wrist & Forearm Splint any size [X9147] Planning Comments:   Xray obtained and read by radiology as elbow normal, wrist no acute findings (s/p ORIF and healed distal radius fx).  Ice, elevation, rest.  Rx for Tramadol.  Will place in wrist splint to reduce motion at wrist and elbow.  Possible medial epicondylitis which is compressing ulnar nerve causing distal radiation.  If not improving after a few days, send to sports med. She has an appt with her PCP in 5 days anyway   The patient and/or caregiver has been counseled thoroughly with regard to medications prescribed including dosage, schedule,  interactions, rationale for use, and possible side effects and they verbalize understanding.  Diagnoses and expected course of recovery discussed and will return if not improved as expected or if the condition worsens. Patient and/or caregiver verbalized understanding.  Prescriptions: TRAMADOL HCL 50 MG TABS (TRAMADOL HCL) 1 by mouth q6 hrs as needed for pain  #20 x 0   Entered and Authorized by:   Hoyt Koch MD   Signed by:   Hoyt Koch MD on 05/28/2011   Method used:   Print then Give to Patient   RxID:   [id number]   Orders Added: 1)  Est. Patient Level IV [96295] 2)  T-DG Elbow Complete*L* [73080] 3)  T-DG Wrist Complete*L* [73110] 4)  Wrist & Forearm Splint any size [M8413]

## 2011-09-12 NOTE — Progress Notes (Signed)
Summary: CYST procedure rm   Vital Signs:  Patient Profile:   40 Years Old Female CC:      cyst on LLQ abd x 1wk Height:     70 inches Weight:      281 pounds O2 Sat:      97 % O2 treatment:    Room Air Temp:     98.3 degrees F oral Pulse rate:   106 / minute Resp:     16 per minute BP sitting:   110 / 72  (left arm) Cuff size:   large  Vitals Entered By: Clemens Catholic LPN (May 03, 2011 1:23 PM)                  Updated Prior Medication List: NEURONTIN 300 MG CAPS (GABAPENTIN) Take 1 tablet by mouth three times a day LANTUS SOLOSTAR 100 UNIT/ML SOLN (INSULIN GLARGINE) 80 unit [place] daily * BD ULTRA-FINE NEEDLES use as directed METFORMIN HCL 1000 MG TABS (METFORMIN HCL) one tablet by mouth two times a day APIDRA 100 UNIT/ML SOLN (INSULIN GLULISINE) Give dose with each meal per SSI. Up to 60 units daily. LISINOPRIL-HYDROCHLOROTHIAZIDE 20-25 MG TABS (LISINOPRIL-HYDROCHLOROTHIAZIDE) 1/2 tab by mouth dialy for one week, then inc to whole tab daily * INSULIN NEEDLES AND SYRINGLES FOR THE APIDRA VIALS. inject three times a day * STRIPS FOR THE ACCUCHECK MACHINE. Tests 4 x a day Dx 250.02 TRICOR 145 MG TABS (FENOFIBRATE)   Current Allergies: No known allergies History of Present Illness History from: patient Chief Complaint: cyst on LLQ abd x 1wk History of Present Illness: Cyst on LLQ for a week. No F/C/N/V.  She has a history of cysts treated with ABX and/or I&D.  The redness seems to be spreading out from the central wound.  No drainage.  She is diabetic and her morning sugar was 180 today.  She is a Engineer, civil (consulting).  She is going to the beach next week and wants to make sure that it's ok to go in the water.    REVIEW OF SYSTEMS Constitutional Symptoms      Denies fever, chills, night sweats, weight loss, weight gain, and fatigue.  Eyes       Denies change in vision, eye pain, eye discharge, glasses, contact lenses, and eye surgery. Ear/Nose/Throat/Mouth       Denies hearing  loss/aids, change in hearing, ear pain, ear discharge, dizziness, frequent runny nose, frequent nose bleeds, sinus problems, sore throat, hoarseness, and tooth pain or bleeding.  Respiratory       Denies dry cough, productive cough, wheezing, shortness of breath, asthma, bronchitis, and emphysema/COPD.  Cardiovascular       Denies murmurs, chest pain, and tires easily with exhertion.    Gastrointestinal       Denies stomach pain, nausea/vomiting, diarrhea, constipation, blood in bowel movements, and indigestion. Genitourniary       Denies painful urination, kidney stones, and loss of urinary control. Neurological       Denies paralysis, seizures, and fainting/blackouts. Musculoskeletal       Denies muscle pain, joint pain, joint stiffness, decreased range of motion, redness, swelling, muscle weakness, and gout.  Skin       Denies bruising, unusual mles/lumps or sores, and hair/skin or nail changes.  Psych       Denies mood changes, temper/anger issues, anxiety/stress, speech problems, depression, and sleep problems. Other Comments: pt c/o cyst on her LLQ abd x 1wk. no fever.she has done warm comprresses at home with  no relief. her FBS this AM per pt was 180.   Past History:  Past Medical History: Diabetes mellitus, type II Hyperlipidemia Hypertension  Past Surgical History: Reviewed history from 12/08/2009 and no changes required. R open wrist reduction Ovarian cyst removed 2001 Uterine ablation 2008  Family History: Reviewed history from 12/08/2009 and no changes required. Family History Hypertension Father with esophageal Cancer, HTN Mother with DM.    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 married to [place] with one child.  Never Smoked Alcohol use-no Drug use-no Regular exercise-Yes, jazzercise 6 days a week.  Physical Exam General appearance: well developed, obese, no acute  distress MSE: oriented to time, place, and person RLQ with 2cm abscess, no fluctuance, minimal induration.  Surrounding erythema approx  8-10 cm in all directions.  Mild tenderness.  No drainage, bleeding, or pus.  No other lesions are seen. Assessment New Problems: CELLULITIS AND ABSCESS OF TRUNK (ICD-682.2) HYPERTENSION (ICD-401.9) HYPERLIPIDEMIA (ICD-272.4)   Plan New Medications/Changes: DOXYCYCLINE HYCLATE 100 MG CAPS (DOXYCYCLINE HYCLATE) 1 by mouth two times a day for 10 days  #20 x 0, 05/03/2011, Hoyt Koch MD BACTRIM DS 800-160 MG TABS (SULFAMETHOXAZOLE-TRIMETHOPRIM) 1 by mouth two times a day for 10 days  #20 x 0, 05/03/2011, Hoyt Koch MD  New Orders: Est. Patient Level III [phone] Planning Comments:   On exam, I don't feel enough fluctuance to be sure that there's anything to drain yet.  Especially since [place] the [place]. Will start on Bactrim and Doxy (caution with photosensitivity) and see if that works well.  Also warm compresses. If worsening, F/C, or new symptoms, then may need I&D.  She needs to be better with controlling her BS.  Keep that area clean and dry.   The patient and/or caregiver has been counseled thoroughly with regard to medications prescribed including dosage, schedule, interactions, rationale for use, and possible side effects and they verbalize understanding.  Diagnoses and expected course of recovery discussed and will return if not improved as expected or if the condition worsens. Patient and/or caregiver verbalized understanding.  Prescriptions: DOXYCYCLINE HYCLATE 100 MG CAPS (DOXYCYCLINE HYCLATE) 1 by mouth two times a day for 10 days  #20 x 0   Entered and Authorized by:   Hoyt Koch MD   Signed by:   Hoyt Koch MD on 05/03/2011   Method used:   Print then Give to Patient   RxID:   [id number] BACTRIM DS 800-160 MG TABS (SULFAMETHOXAZOLE-TRIMETHOPRIM) 1 by mouth two times a day for 10 days   #20 x 0   Entered and Authorized by:   Hoyt Koch MD   Signed by:   Hoyt Koch MD on 05/03/2011   Method used:   Print then Give to Patient   RxID:   [id number]   Orders Added: 1)  Est. Patient Level III [44010]

## 2011-10-07 ENCOUNTER — Other Ambulatory Visit: Payer: Self-pay | Admitting: Family Medicine

## 2011-11-05 ENCOUNTER — Other Ambulatory Visit: Payer: Self-pay | Admitting: Family Medicine

## 2011-11-07 NOTE — Telephone Encounter (Signed)
Needs appointment

## 2011-11-30 ENCOUNTER — Other Ambulatory Visit: Payer: Self-pay | Admitting: *Deleted

## 2011-11-30 MED ORDER — INSULIN GLULISINE 100 UNIT/ML IJ SOLN: INTRAMUSCULAR | Status: DC

## 2011-12-02 ENCOUNTER — Encounter: Payer: Self-pay | Admitting: *Deleted

## 2011-12-02 ENCOUNTER — Other Ambulatory Visit: Payer: Self-pay | Admitting: *Deleted

## 2011-12-02 MED ORDER — METFORMIN HCL 1000 MG PO TABS: 1000.0000 mg | ORAL_TABLET | Freq: Two times a day (BID) | ORAL | Status: DC

## 2011-12-07 ENCOUNTER — Ambulatory Visit (INDEPENDENT_AMBULATORY_CARE_PROVIDER_SITE_OTHER): Payer: BC Managed Care – PPO | Admitting: Family Medicine

## 2011-12-07 ENCOUNTER — Encounter: Payer: Self-pay | Admitting: Family Medicine

## 2011-12-07 DIAGNOSIS — E119 Type 2 diabetes mellitus without complications: Secondary | ICD-10-CM

## 2011-12-07 DIAGNOSIS — E669 Obesity, unspecified: Secondary | ICD-10-CM

## 2011-12-07 NOTE — Progress Notes (Signed)
  Subjective:    Patient ID: Danielle Hodges, female    DOB: [birth year], 41 y.o.   MRN: [id number]  HPI DM - going to [facility] on Monday to dicusse Lap band surgery. Using 90 units of lantus.  Apidra 30 units before each meal. No lows in the middle of the night. She actually gives extra 30 unit so apidra  At bedtime without a snack.  No nubnmnes or tingling. No CP or SOb.    Review of Systems     Objective:   Physical Exam  Constitutional: She is oriented to person, place, and time. She appears well-developed and well-nourished.  HENT:  Head: Normocephalic and atraumatic.  Cardiovascular: Normal rate, regular rhythm and normal heart sounds.   Pulmonary/Chest: Effort normal and breath sounds normal.  Neurological: She is alert and oriented to person, place, and time.  Skin: Skin is warm and dry.  Psychiatric: She has a normal mood and affect. Her behavior is normal.          Assessment & Plan:  DM - Increase Lantus to 100. Hold last apidra dose and call me with Am sugar levels and we will adjust over the phone. I wonder if she is going low in Am and then getting overnight release of glucose from the liver.  F/U in 6 months.  Foot exam performed today.  Bp is bordlerine.

## 2011-12-09 HISTORY — PX: LITHOTRIPSY: SUR834

## 2011-12-13 ENCOUNTER — Other Ambulatory Visit: Payer: Self-pay | Admitting: Family Medicine

## 2011-12-30 ENCOUNTER — Other Ambulatory Visit: Payer: Self-pay | Admitting: Family Medicine

## 2012-01-05 ENCOUNTER — Other Ambulatory Visit: Payer: Self-pay | Admitting: Family Medicine

## 2012-01-15 ENCOUNTER — Emergency Department
Admission: EM | Admit: 2012-01-15 | Discharge: 2012-01-15 | Disposition: A | Discharge status: Home / Self Care | Payer: BC Managed Care – PPO | Source: Home / Self Care | Attending: Family Medicine | Admitting: Family Medicine

## 2012-01-15 DIAGNOSIS — N390 Urinary tract infection, site not specified: Secondary | ICD-10-CM

## 2012-01-15 LAB — POCT URINALYSIS DIP (MANUAL ENTRY)
Bilirubin, UA: NEGATIVE
Glucose, UA: >=1000
Ketones, POC UA: NEGATIVE
Nitrite, UA: POSITIVE

## 2012-01-15 MED ORDER — CIPROFLOXACIN HCL 500 MG PO TABS: 500.0000 mg | ORAL_TABLET | Freq: Two times a day (BID) | ORAL | Status: AC

## 2012-01-15 NOTE — Discharge Instructions (Signed)
Increase fluid intake. Check temperature daily. If symptoms become significantly worse during the night proceed to the local emergency room.

## 2012-01-15 NOTE — ED Provider Notes (Addendum)
History     CSN: [id number]  Arrival date & time 01/15/12  1144   First MD Initiated Contact with Patient 01/15/12 1228      Chief Complaint  Patient presents with  . Dysuria      HPI Comments: Patient reports that she underwent lithotripsy one month ago because of a large right renal stone, and a stent was placed.  Three days ago she developed right flank pain and today she developed fever to 101+.  She has had dysuria and frequency.  Her glucose was elevated today (202).  No nausea/vomiting.  She is scheduled to see her urologist in three days.  Patient is a 41 y.o. female presenting with dysuria. The history is provided by the patient.  Dysuria  This is a new problem. Episode onset: 3 days ago. The problem occurs every urination. The problem has been gradually worsening. The quality of the pain is described as burning. The maximum temperature recorded prior to her arrival was 101 to 101.9 F. Associated symptoms include chills, sweats, frequency, urgency and flank pain. Pertinent negatives include no nausea, no vomiting, no discharge, no hematuria and no hesitancy. She has tried nothing for the symptoms. Her past medical history is significant for kidney stones and urological procedure.    Past Medical History  Diagnosis Date  . Diabetes mellitus   . Hyperlipidemia   . Hypertension     Past Surgical History  Procedure Date  . Orif wrist fracture   . Ovarian cyst removal   . Uterine ablation   . Lithotripsy march 2013    History reviewed. No pertinent family history.  History  Substance Use Topics  . Smoking status: Never Smoker   . Smokeless tobacco: Not on file  . Alcohol Use: No    OB History    Grav Para Term Preterm Abortions TAB SAB Ect Mult Living                  Review of Systems  Constitutional: Positive for chills.  Gastrointestinal: Negative for nausea and vomiting.  Genitourinary: Positive for dysuria, urgency, frequency and flank pain. Negative for  hesitancy and hematuria.    Allergies  Review of patient's allergies indicates no known allergies.  Home Medications   Current Outpatient Rx  Name Route Sig Dispense Refill  . PHENAZOPYRIDINE HCL 100 MG PO TABS Oral Take 100 mg by mouth 3 (three) times daily as needed.    Marland Kitchen TAMSULOSIN HCL 0.4 MG PO CAPS Oral Take by mouth.    Marland Kitchen CIPROFLOXACIN HCL 500 MG PO TABS Oral Take 1 tablet (500 mg total) by mouth 2 (two) times daily. 14 tablet 0  . FENOFIBRATE 145 MG PO TABS  TAKE 1 TABLET (145 MG TOTAL) BY MOUTH DAILY. 30 tablet 0  . GABAPENTIN 300 MG PO CAPS  TAKE ONE CAPSULE 3 TIMES A DAY 90 capsule 0  . FREESTYLE SYSTEM KIT Does not apply 1 each by Does not apply route as needed for other. Can substitute if another glucometer is preferred by her insurance. Also lancets and strips to test up to 3x a day.  SHe is on Insulin.  Dx 250.02 1 each 0  . INSULIN GLULISINE 100 UNIT/ML IJ SOLN Subcutaneous Inject 18 Units into the skin 3 (three) times daily before meals. 20 mL 0  . INSULIN GLULISINE 100 UNIT/ML [place] SOLN Subcutaneous Inject into the skin. 20 units before each meal     . LANTUS SOLOSTAR 100 UNIT/ML  SOLN  INJECT 90 UNITS INTO THE SKIN DAILY. 30 mL 3  . LOSARTAN POTASSIUM-HCTZ 100-25 MG PO TABS  TAKE 1 TABLET EVERY DAY 30 tablet 3  . METFORMIN HCL 1000 MG PO TABS  TAKE 1 TABLET BY MOUTH TWICE A DAY 60 tablet 2  . METFORMIN HCL 1000 MG PO TABS  TAKE 1 TABLET TWICE A DAY WITH FOOD 60 tablet 2  . METOPROLOL SUCCINATE ER 25 MG PO TB24  TAKE 1 TABLET EVERY DAY 30 tablet 2    BP 107/74  Pulse 95  Temp(Src) 98.3 F (36.8 C) (Oral)  Resp 20  Ht 5\' 10"  (1.778 m)  Wt 294 lb (133.358 kg)  BMI 42.18 kg/m2  SpO2 98%  Physical Exam Nursing notes and Vital Signs reviewed. Appearance:  Patient appears stated age, and in no acute distress.  Patient is obese (BMI 42.3)  Eyes:  Pupils are equal, round, and reactive to light and accomodation.  Extraocular movement is intact.  Conjunctivae are not  inflamed  Pharynx:  Normal, moist mucous membranes  Neck:  Supple.   No adenopathy Lungs:  Clear to auscultation.  Breath sounds are equal.  Heart:  Regular rate and rhythm without murmurs, rubs, or gallops.  Abdomen:  Nontender without masses or hepatosplenomegaly.  Bowel sounds are present.  No CVA or flank tenderness.  Extremities:  No edema.  No calf tenderness Skin:  No rash present.   ED Course  Procedures  none   Labs Reviewed  POCT URINALYSIS DIP (MANUAL ENTRY) positive nitrite, small leuks, prot 100 mg/dL, large blood, gluc > 1000mg /dL  URINE CULTURE pending      1. UTI (urinary tract infection)       MDM  Urine culture pending. Begin Cipro.  Continue Pyridium. Increase fluid intake. Check temperature daily. If symptoms become significantly worse during the night proceed to the local emergency room.  Followup with urologist as scheduled.       Lattie Haw, MD 01/16/12 1501  Urine culture:  Klebsiella Pneumoniae sensitive to Cipro  Lattie Haw, MD 01/19/12 1129

## 2012-01-15 NOTE — ED Notes (Signed)
Had lithrotripsy on 3/13 unable to break up stone and stent still in place. On Thursday noted with increase pain during urination on Sat noted with fever of 101.0

## 2012-01-17 ENCOUNTER — Other Ambulatory Visit: Payer: Self-pay | Admitting: Family Medicine

## 2012-01-17 ENCOUNTER — Telehealth: Payer: Self-pay | Admitting: *Deleted

## 2012-01-17 LAB — URINE CULTURE: Colony Count: >=100000

## 2012-01-18 ENCOUNTER — Ambulatory Visit: Payer: BC Managed Care – PPO | Admitting: Family Medicine

## 2012-02-04 ENCOUNTER — Other Ambulatory Visit: Payer: Self-pay | Admitting: Family Medicine

## 2012-02-14 ENCOUNTER — Other Ambulatory Visit: Payer: Self-pay | Admitting: Family Medicine

## 2012-03-05 ENCOUNTER — Other Ambulatory Visit: Payer: Self-pay | Admitting: Family Medicine

## 2012-03-12 ENCOUNTER — Other Ambulatory Visit: Payer: Self-pay | Admitting: Family Medicine

## 2012-03-21 HISTORY — PX: ROUX-EN-Y GASTRIC BYPASS: SHX1104

## 2012-03-21 HISTORY — PX: HERNIA REPAIR: SHX51

## 2012-03-30 ENCOUNTER — Encounter: Payer: Self-pay | Admitting: Family Medicine

## 2012-03-31 IMAGING — CR DG WRIST COMPLETE 3+V*L*
3 series · 3 of 3 positions shown · non-contrast
Comparison: None.

CLINICAL DATA: Pain, no known acute injury.

LEFT WRIST - COMPLETE 3+ VIEW

[view not recorded (1 of 3)]
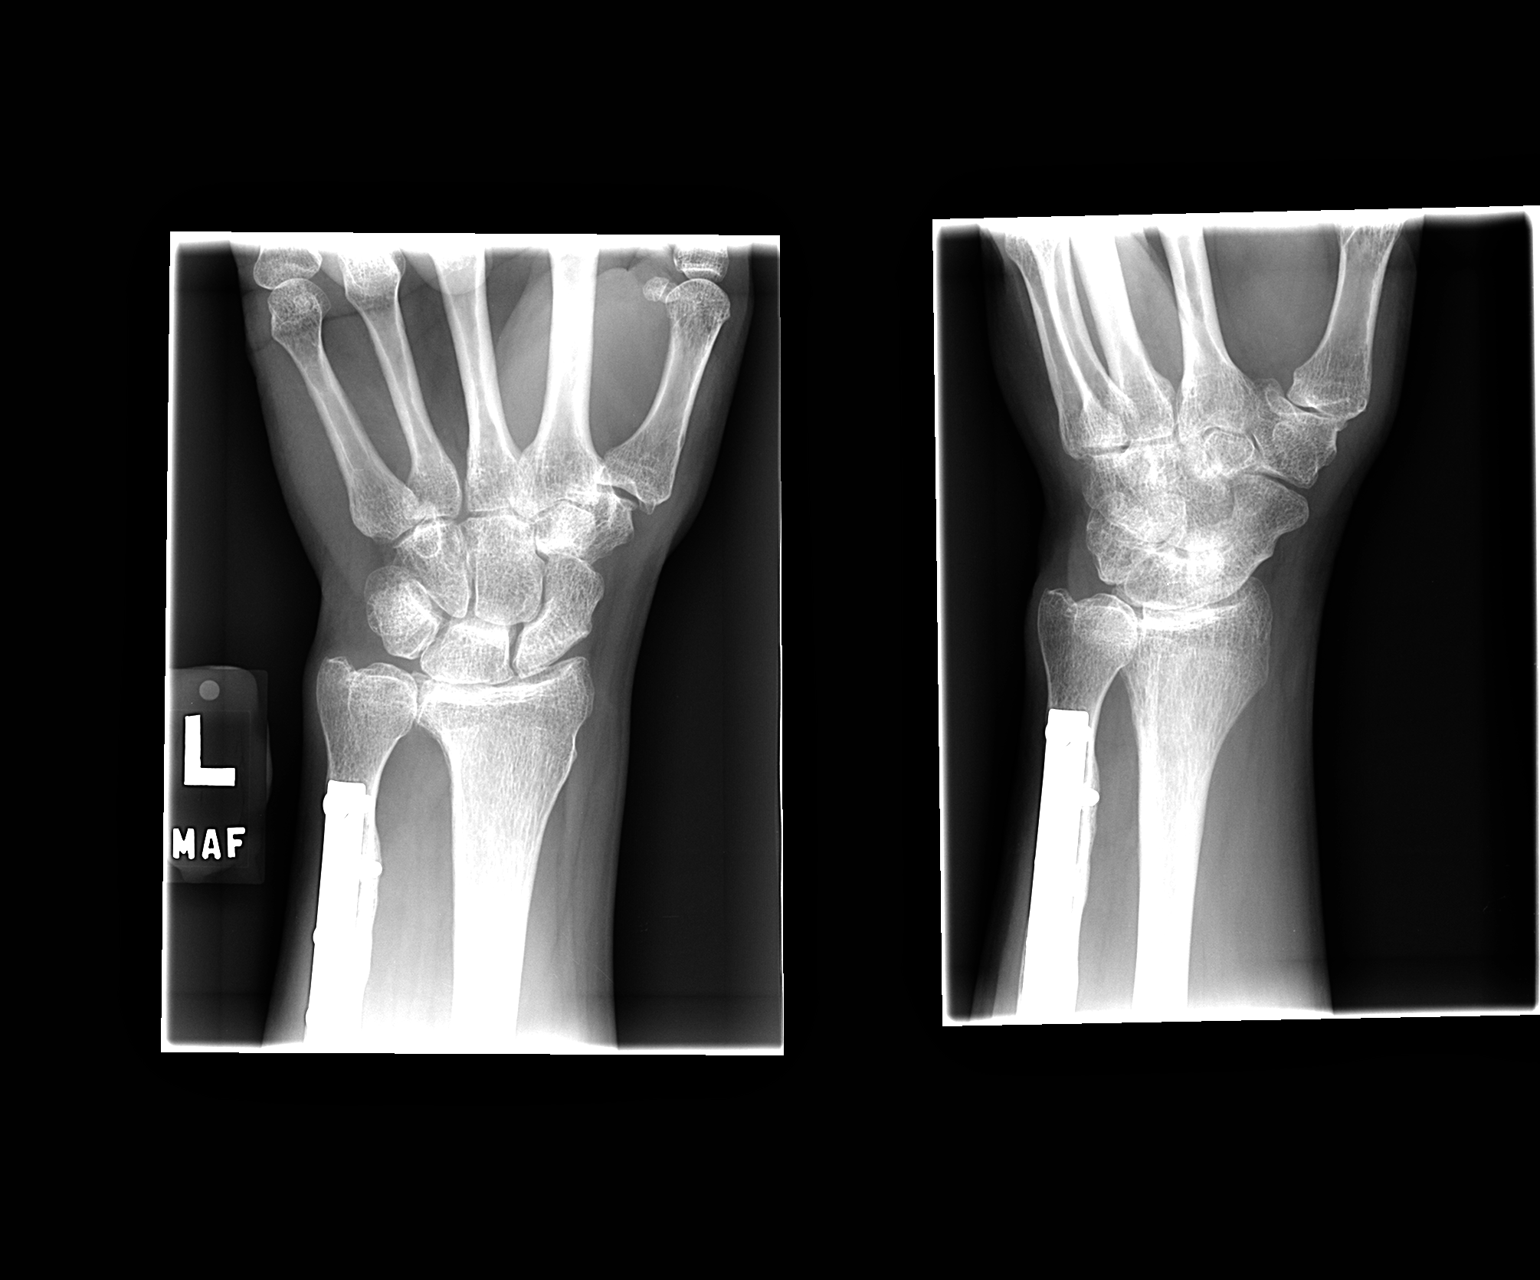

[view not recorded (2 of 3)]
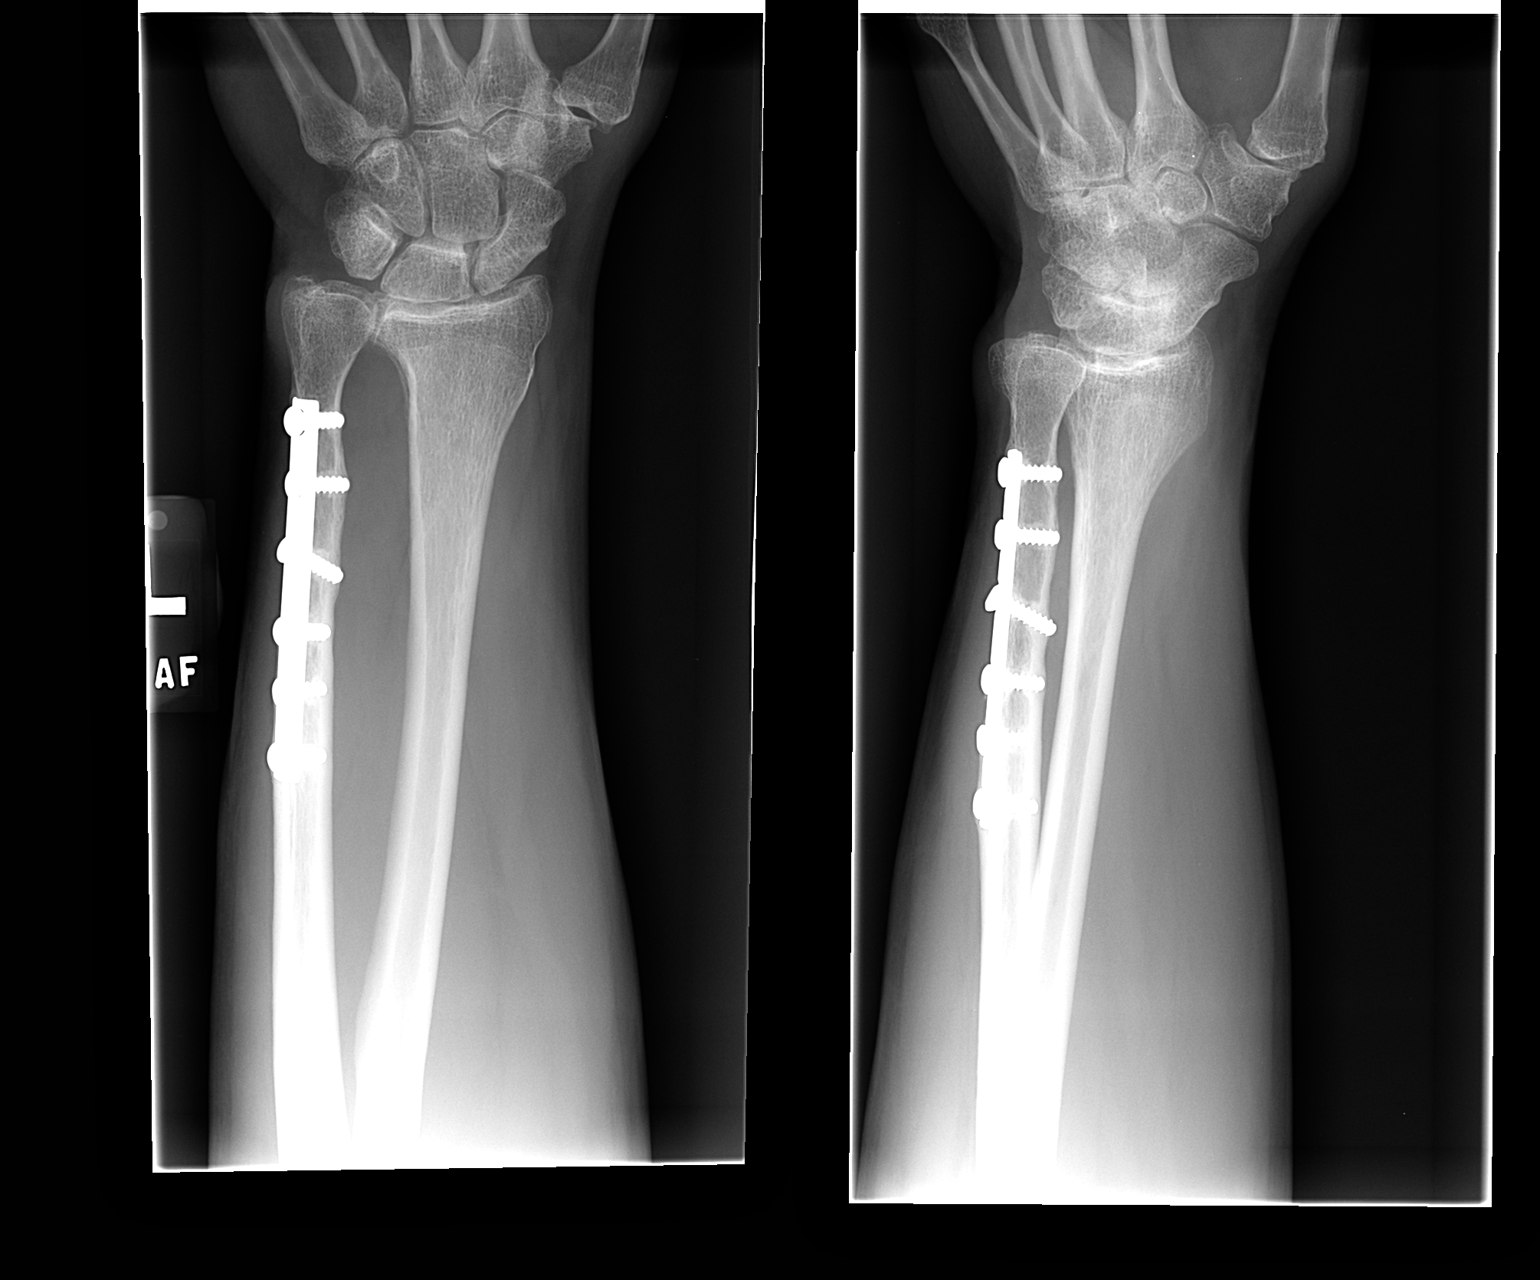

[view not recorded (3 of 3)]
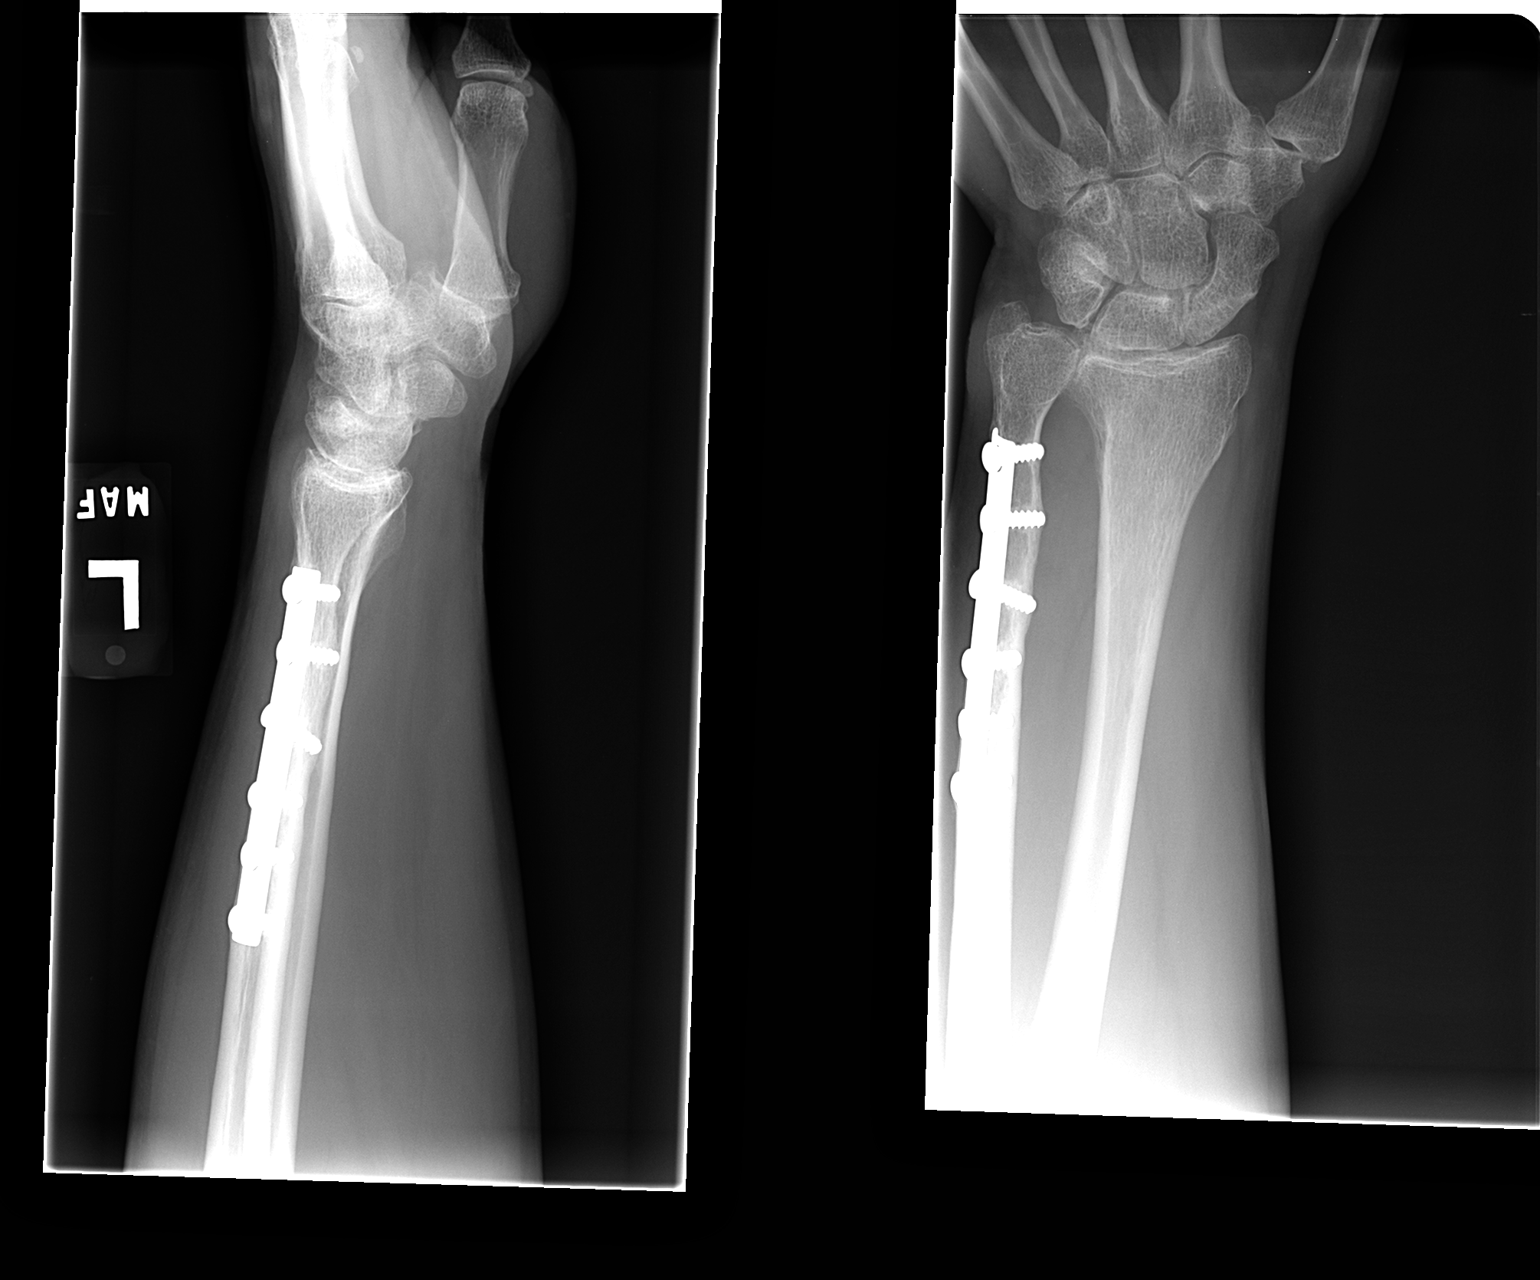

[3 of 3 positions shown; findings below may reference images not displayed]

FINDINGS: Status post ORIF distal ulnar fracture.  Healed distal
radius fracture.  Marked   narrowing radiocarpal joint.
IMPRESSION: No acute findings.

## 2012-04-02 ENCOUNTER — Other Ambulatory Visit: Payer: Self-pay | Admitting: *Deleted

## 2012-04-02 MED ORDER — AMBULATORY NON FORMULARY MEDICATION: Status: DC

## 2012-04-10 ENCOUNTER — Ambulatory Visit: Payer: BC Managed Care – PPO | Admitting: Family Medicine

## 2012-04-10 DIAGNOSIS — Z0289 Encounter for other administrative examinations: Secondary | ICD-10-CM

## 2012-04-12 ENCOUNTER — Telehealth: Payer: Self-pay | Admitting: Family Medicine

## 2012-04-12 NOTE — Telephone Encounter (Signed)
Cal pt: needs appt for diabetes. Overdue.

## 2012-04-13 NOTE — Telephone Encounter (Signed)
Called and spoke with pt's spouse and pt will call back to schedule hospital f/u and schedule f/u for DM

## 2012-06-14 ENCOUNTER — Telehealth: Payer: Self-pay | Admitting: Family Medicine

## 2012-06-14 NOTE — Telephone Encounter (Signed)
Call pt: I received a letter from her insurance company signed she's nonadherent with her cholesterol medication. Does want to send her a friendly remind her to take her cholesterol medication daily.

## 2012-06-14 NOTE — Telephone Encounter (Signed)
Pt informed

## 2013-01-11 ENCOUNTER — Ambulatory Visit (INDEPENDENT_AMBULATORY_CARE_PROVIDER_SITE_OTHER): Payer: BC Managed Care – PPO | Admitting: Family Medicine

## 2013-01-11 ENCOUNTER — Other Ambulatory Visit (HOSPITAL_COMMUNITY)
Admission: RE | Admit: 2013-01-11 | Discharge: 2013-01-11 | Disposition: A | Discharge status: Home / Self Care | Payer: BC Managed Care – PPO | Source: Ambulatory Visit | Attending: Family Medicine | Admitting: Family Medicine

## 2013-01-11 ENCOUNTER — Encounter: Payer: Self-pay | Admitting: Family Medicine

## 2013-01-11 VITALS — BP 100/62 | HR 60 | Ht 70.0 in | Wt 197.0 lb

## 2013-01-11 DIAGNOSIS — Z01419 Encounter for gynecological examination (general) (routine) without abnormal findings: Secondary | ICD-10-CM | POA: Insufficient documentation

## 2013-01-11 DIAGNOSIS — Z1231 Encounter for screening mammogram for malignant neoplasm of breast: Secondary | ICD-10-CM

## 2013-01-11 DIAGNOSIS — Z124 Encounter for screening for malignant neoplasm of cervix: Secondary | ICD-10-CM

## 2013-01-11 DIAGNOSIS — Z1151 Encounter for screening for human papillomavirus (HPV): Secondary | ICD-10-CM | POA: Insufficient documentation

## 2013-01-11 DIAGNOSIS — E669 Obesity, unspecified: Secondary | ICD-10-CM

## 2013-01-11 LAB — POCT UA - MICROALBUMIN
Albumin/Creatinine Ratio, Urine, POC: <30
Microalbumin Ur, POC: 10 mg/dL

## 2013-01-11 LAB — POCT GLYCOSYLATED HEMOGLOBIN (HGB A1C): Hemoglobin A1C: 5.5

## 2013-01-11 NOTE — Progress Notes (Signed)
Subjective:     Danielle Hodges is a 42 y.o. female and is here for a comprehensive physical exam. The patient reports no problems. 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 She is on fantastic and has lost over 100 pounds at this point time. She's no longer diabetic or hypertensive. She's been taken off all her prescription medications. She'll he takes B12 shots monthly and a vitamin D supplement. She has one more followup with her surgeon this summer and after that we will be taking over her regular care.  History   Social History  . Marital Status: Married    Spouse Name: N/A    Number of Children: N/A  . Years of Education: N/A   Occupational History  . Not on file.   Social History Main Topics  . Smoking status: Never Smoker   . Smokeless tobacco: Not on file  . Alcohol Use: No  . Drug Use: No  . Sexually Active: Yes -- Female partner(s)   Other Topics Concern  . Not on file   Social History Narrative   Some regular exercise 5 days per week.    Health Maintenance  Topic Date Due  . Pap Smear  10/10/2010  . Influenza Vaccine  06/10/2013  . Tetanus/tdap  06/01/2021    The following portions of the patient's history were reviewed and updated as appropriate: allergies, current medications, past family history, past medical history, past social history, past surgical history and problem list.  Review of Systems A comprehensive review of systems was negative.   Objective:    BP 100/62  Pulse 60  Ht 5\' 10"  (1.778 m)  Wt 197 lb (89.359 kg)  BMI 28.27 kg/m2 General appearance: alert, cooperative and appears stated age Head: Normocephalic, without obvious abnormality, atraumatic Eyes: conj clear, EOMi, PEERLA Ears: normal TM's and external ear canals both ears Nose: Nares normal. Septum midline. Mucosa normal. No drainage or sinus tenderness. Throat: lips, mucosa, and tongue normal; teeth and gums normal Neck: no adenopathy, no carotid bruit, no JVD, supple, symmetrical, trachea  midline and thyroid not enlarged, symmetric, no tenderness/mass/nodules Back: symmetric, no curvature. ROM normal. No CVA tenderness. Lungs: clear to auscultation bilaterally Breasts: normal appearance, no masses or tenderness Heart: regular rate and rhythm, S1, S2 normal, no murmur, click, rub or gallop Abdomen: soft, non-tender; bowel sounds normal; no masses,  no organomegaly Pelvic: cervix normal in appearance, external genitalia normal, no adnexal masses or tenderness, no cervical motion tenderness, rectovaginal septum normal, uterus normal size, shape, and consistency, vagina normal without discharge and some mild bladder prolaspe Extremities: extremities normal, atraumatic, no cyanosis or edema Pulses: 2+ and symmetric Skin: Skin color, texture, turgor normal. No rashes or lesions Lymph nodes: Cervical, supraclavicular, and axillary nodes normal. Neurologic: Alert and oriented X 3, normal strength and tone. Normal symmetric reflexes. Normal coordination and gait    Assessment:    Healthy female exam.      Plan:     Keep up a regular exercise program and make sure you are eating a healthy diet Try to eat 4 servings of dairy a day, or if you are lactose intolerant take a calcium with vitamin D daily.  Your vaccines are up to date.  Due for Pap smear. We will call with results next week. She is due for screening mammogram now that she is 42. She did have one at 37 that was normal. She did get her flu shot at work this past year. Continue with healthy diet and  regular exercise.  See After Visit Summary for Counseling Recommendations

## 2013-01-11 NOTE — Patient Instructions (Signed)
Keep up a regular exercise program and make sure you are eating a healthy diet Try to eat 4 servings of dairy a day, or if you are lactose intolerant take a calcium with vitamin D daily.  Your vaccines are up to date.   

## 2013-01-26 ENCOUNTER — Encounter: Payer: Self-pay | Admitting: Family Medicine

## 2013-01-28 MED ORDER — TRAMADOL HCL 50 MG PO TABS: 50.0000 mg | ORAL_TABLET | Freq: Two times a day (BID) | ORAL | Status: DC

## 2013-02-20 ENCOUNTER — Other Ambulatory Visit: Payer: Self-pay | Admitting: Family Medicine

## 2013-03-07 ENCOUNTER — Other Ambulatory Visit: Payer: Self-pay | Admitting: Family Medicine

## 2013-03-12 ENCOUNTER — Ambulatory Visit (INDEPENDENT_AMBULATORY_CARE_PROVIDER_SITE_OTHER): Payer: BC Managed Care – PPO

## 2013-03-12 DIAGNOSIS — Z1231 Encounter for screening mammogram for malignant neoplasm of breast: Secondary | ICD-10-CM

## 2013-03-12 DIAGNOSIS — Z124 Encounter for screening for malignant neoplasm of cervix: Secondary | ICD-10-CM

## 2013-03-14 ENCOUNTER — Encounter: Payer: Self-pay | Admitting: *Deleted

## 2013-03-25 ENCOUNTER — Other Ambulatory Visit: Payer: Self-pay | Admitting: Family Medicine

## 2013-03-26 NOTE — Telephone Encounter (Signed)
Rx sent to [facility] 

## 2013-03-28 ENCOUNTER — Encounter: Payer: Self-pay | Admitting: Family Medicine

## 2013-03-31 ENCOUNTER — Other Ambulatory Visit: Payer: Self-pay | Admitting: Family Medicine

## 2013-03-31 MED ORDER — ONDANSETRON 4 MG PO TBDP: 4.0000 mg | ORAL_TABLET | Freq: Three times a day (TID) | ORAL | Status: DC | PRN

## 2013-04-15 ENCOUNTER — Emergency Department (INDEPENDENT_AMBULATORY_CARE_PROVIDER_SITE_OTHER): Payer: BC Managed Care – PPO

## 2013-04-15 ENCOUNTER — Emergency Department
Admission: EM | Admit: 2013-04-15 | Discharge: 2013-04-15 | Disposition: A | Discharge status: Home / Self Care | Payer: BC Managed Care – PPO | Source: Home / Self Care | Attending: Family Medicine | Admitting: Family Medicine

## 2013-04-15 ENCOUNTER — Encounter: Payer: Self-pay | Admitting: *Deleted

## 2013-04-15 DIAGNOSIS — S8990XA Unspecified injury of unspecified lower leg, initial encounter: Secondary | ICD-10-CM

## 2013-04-15 DIAGNOSIS — M79609 Pain in unspecified limb: Secondary | ICD-10-CM

## 2013-04-15 DIAGNOSIS — S8992XA Unspecified injury of left lower leg, initial encounter: Secondary | ICD-10-CM

## 2013-04-15 DIAGNOSIS — W2203XA Walked into furniture, initial encounter: Secondary | ICD-10-CM

## 2013-04-15 NOTE — ED Notes (Signed)
Pt c/o LT shin pain x today, after hitting it on the side of a bed.

## 2013-04-15 NOTE — ED Provider Notes (Signed)
History    CSN: [id number] Arrival date & time 04/15/13  1517  First MD Initiated Contact with Patient 04/15/13 1530     Chief Complaint  Patient presents with  . Leg Pain    HPI  Shin pain x1 day. 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 Has had persistent pain since this point. Pain seems to be worse with weightbearing. Patient denies any numbness or paresthesias. Patient wanted to be evaluated to make sure there was no fracture present.  Past Medical History  Diagnosis Date  . Diabetes mellitus   . Hyperlipidemia   . Hypertension    Past Surgical History  Procedure Laterality Date  . Orif wrist fracture    . Ovarian cyst removal    . Uterine ablation    . Lithotripsy  march 2013  . 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 Hernia repair  03/21/12   Family History  Problem Relation Age of Onset  . Esophageal cancer Father     environmental exposure   History  Substance Use Topics  . Smoking status: Never Smoker   . Smokeless tobacco: Not on file  . Alcohol Use: No   OB History   Grav Para Term Preterm Abortions TAB SAB Ect Mult Living                 Review of Systems  All other systems reviewed and are negative.    Allergies  Review of patient's allergies indicates no known allergies.  Home Medications   Current Outpatient Rx  Name  Route  Sig  Dispense  Refill  . Cyanocobalamin (VITAMIN B-12 IJ)   Injection   Inject 1,000 mg as directed every 30 (thirty) days.          . ondansetron (ZOFRAN-ODT) 4 MG disintegrating tablet   Oral   Take 1 tablet (4 mg total) by mouth every 8 (eight) hours as needed for nausea.   20 tablet   0   . traMADol (ULTRAM) 50 MG tablet      TAKE 1 TABLET (50 MG TOTAL) BY MOUTH 2 (TWO) TIMES DAILY.   60 tablet   0   . Vitamin D, Ergocalciferol, (DRISDOL) 50000 UNITS CAPS   Oral   Take 50,000 Units by mouth every 7 (seven) days.           BP 120/75  Pulse 72  Temp(Src) 97.9 F (36.6 C) (Oral)  Resp 18  Wt 203 lb (92.08 kg)  BMI 29.13 kg/m2  SpO2 99% Physical Exam  Constitutional: She appears well-developed and well-nourished.  HENT:  Head: Normocephalic and atraumatic.  Eyes: Pupils are equal, round, and reactive to light.  Neck: Normal range of motion.  Cardiovascular: Normal rate and regular rhythm.   Pulmonary/Chest: Effort normal.  Abdominal: Soft.  Musculoskeletal:       Legs: Skin: Skin is warm.    ED Course  Procedures (including critical care time) Labs Reviewed - No data to display Dg Tibia/fibula Left  04/15/2013   *RADIOLOGY REPORT*  Clinical Data: Left lower leg pain following an injury today.  LEFT TIBIA AND FIBULA - 2 VIEW  Comparison: None.  Findings: Mild lateral subcutaneous edema.  No fracture or dislocation.  IMPRESSION: No fracture.   Original Report Authenticated By: Beckie Salts, M.D.   1. Shin injury, left, initial encounter     MDM  Xrays negative for fracture.  Likely shin contusion. RICE and NSAIDs.  Discussed general care and MSK red flags.  Follow up as needed.     The patient and/or caregiver has been counseled thoroughly with regard to treatment plan and/or medications prescribed including dosage, schedule, interactions, rationale for use, and possible side effects and they verbalize understanding. Diagnoses and expected course of recovery discussed and will return if not improved as expected or if the condition worsens. Patient and/or caregiver verbalized understanding.       Doree Albee, MD 04/15/13 [phone]

## 2013-04-21 ENCOUNTER — Other Ambulatory Visit: Payer: Self-pay | Admitting: Family Medicine

## 2013-04-22 NOTE — Telephone Encounter (Signed)
Call pt and let her know I will fill for 30 tabs only. She has been filling this very frequently adn if continue to have pain, then needs appt. She has been on it since May

## 2013-04-22 NOTE — Telephone Encounter (Signed)
We have never filled this med for pt. I would say she needs appt. Agree?

## 2013-04-23 ENCOUNTER — Other Ambulatory Visit: Payer: Self-pay | Admitting: Family Medicine

## 2013-04-23 ENCOUNTER — Encounter: Payer: Self-pay | Admitting: Family Medicine

## 2013-04-23 MED ORDER — TRAMADOL HCL 50 MG PO TABS: 50.0000 mg | ORAL_TABLET | Freq: Every day | ORAL | Status: DC | PRN

## 2013-04-23 NOTE — Telephone Encounter (Signed)
Danielle Hodges, I think I filled this on a day you were not in clinic, i have not seen this patient face to face.

## 2013-04-23 NOTE — Telephone Encounter (Signed)
Med refilled for 30 tabs instead of 60. If needing it chronically needs f/u appt

## 2013-04-29 ENCOUNTER — Telehealth: Payer: Self-pay | Admitting: Family Medicine

## 2013-04-29 NOTE — Telephone Encounter (Signed)
Please call patient. Tramadol refilled for 30 tabs instead of 60. If needing it chronically needs f/u appt.  Then may need alternative tx like PT, injections, etc.

## 2013-04-29 NOTE — Telephone Encounter (Signed)
Called pt and lvm.Danielle Hodges  

## 2013-05-01 NOTE — Telephone Encounter (Signed)
Dr.Metheney responded to this via my chart.Loralee Pacas [place]

## 2013-05-15 ENCOUNTER — Other Ambulatory Visit: Payer: Self-pay | Admitting: Family Medicine

## 2013-05-17 ENCOUNTER — Encounter: Payer: Self-pay | Admitting: Family Medicine

## 2013-05-17 ENCOUNTER — Ambulatory Visit (INDEPENDENT_AMBULATORY_CARE_PROVIDER_SITE_OTHER): Payer: BC Managed Care – PPO | Admitting: Family Medicine

## 2013-05-17 VITALS — BP 112/68 | HR 106 | Wt 205.0 lb

## 2013-05-17 DIAGNOSIS — M25569 Pain in unspecified knee: Secondary | ICD-10-CM

## 2013-05-17 DIAGNOSIS — M25562 Pain in left knee: Secondary | ICD-10-CM

## 2013-05-17 DIAGNOSIS — M545 Low back pain: Secondary | ICD-10-CM

## 2013-05-17 MED ORDER — TRAMADOL HCL 50 MG PO TABS: 50.0000 mg | ORAL_TABLET | Freq: Every day | ORAL | Status: DC | PRN

## 2013-05-17 MED ORDER — CYCLOBENZAPRINE HCL 10 MG PO TABS: 10.0000 mg | ORAL_TABLET | Freq: Every evening | ORAL | Status: DC | PRN

## 2013-05-17 NOTE — Progress Notes (Signed)
  Subjective:    Patient ID: Danielle Hodges, female    DOB: 1971/09/02, 42 y.o.   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  No xrays of her back as well. They felt like from her weight and knee issues likely causing her back pain. Dr. Lyda Kalata was who originally rx the medication. Using tramadol a day for pain control for arthritis.  Once a day is working.  She is mostly taking at night.  Told ot stay off the NSIADS bc of her bypass surgery. Was told will eventually will need knee replacements.  No radiation of pain from her back into her legs. No known trauma or injury. Worse with prolonged sitting and prolonged standing. Her knees hurt more when she's working out regularly   Review of Systems     Objective:   Physical Exam  Constitutional: She is oriented to person, place, and time. She appears well-developed and well-nourished.  HENT:  Head: Normocephalic and atraumatic.  Musculoskeletal:  Knees with normal flexion extension. She does have some significant crepitus with the right being worse than left. She is tender over the medial and lateral joint lines bilaterally. The lumbar spine is mildly tender as well as some mild paraspinous muscle tenderness. Negative straight leg raise bilaterally. Lumbar flexion, since and, rotation right and left and side bending are normal.  Neurological: She is alert and oriented to person, place, and time.  Skin: Skin is warm and dry.  Psychiatric: She has a normal mood and affect. Her behavior is normal.          Assessment & Plan:  Low Back Pain - Discussed alternatives. Recommended she try Tylenol arthritis. I think this would be a great start on should be safe for her stomach. Definitely stay away from NSAIDs. She can continue to use the tramadol but want her about dependency issues and to use it more sparingly and not on a chronic daily basis. I did go ahead and give her refill today. We will also try a muscle relaxer at  bedtime as needed. Again discussed that he should not be used as a maintenance medication. If she feels her symptoms are getting worse we could consider an x-ray. She has no radiculopathy.  Knee pain - consider injections if she is continuing to have pain. She can continue to exercise and do her stretches. Can use Tylenol as her baseline pain reliever and then use the tramadol sparingly.

## 2013-06-20 ENCOUNTER — Other Ambulatory Visit: Payer: Self-pay | Admitting: Family Medicine

## 2013-06-21 ENCOUNTER — Other Ambulatory Visit: Payer: Self-pay | Admitting: *Deleted

## 2013-06-21 MED ORDER — ONDANSETRON 4 MG PO TBDP: ORAL_TABLET | ORAL | Status: DC

## 2013-07-18 ENCOUNTER — Encounter: Payer: Self-pay | Admitting: Emergency Medicine

## 2013-07-18 ENCOUNTER — Emergency Department
Admission: EM | Admit: 2013-07-18 | Discharge: 2013-07-18 | Disposition: A | Discharge status: Home / Self Care | Payer: BC Managed Care – PPO | Source: Home / Self Care | Attending: Emergency Medicine | Admitting: Emergency Medicine

## 2013-07-18 DIAGNOSIS — M722 Plantar fascial fibromatosis: Secondary | ICD-10-CM

## 2013-07-18 NOTE — ED Provider Notes (Signed)
CSN: [id number]     Arrival date & time 07/18/13  4098 History   First MD Initiated Contact with Patient 07/18/13 [phone]     Chief Complaint  Patient presents with  . Foot Pain   (Consider location/radiation/quality/duration/timing/severity/associated sxs/prior Treatment) Patient is a 42 y.o. female presenting with lower extremity pain. The history is provided by the patient.  Foot Pain This is a new problem. Pertinent negatives include no chest pain, no abdominal pain, no headaches and no shortness of breath.   Danielle Hodges c/o left heel pain and pain to the ball of her left foot without injury x 4 days. No new shoes or excessive walking as of recent.  No recent travel or immobilization. Recalls no injury. The heel pain is exacerbated when she goes from lying or sitting to standing and the pain diminishes somewhat when she walks around. Tried Aleve last night which helped .  Past medical history of left leg DVT 3 years ago, but symptoms are not similar to that.  Past Medical History  Diagnosis Date  . Diabetes mellitus   . Hyperlipidemia   . Hypertension    Past Surgical History  Procedure Laterality Date  . Orif wrist fracture    . Ovarian cyst removal    . Uterine ablation    . Lithotripsy  march 2013  . 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 Hernia repair  03/21/12   Family History  Problem Relation Age of Onset  . Esophageal cancer Father     environmental exposure   History  Substance Use Topics  . Smoking status: Never Smoker   . Smokeless tobacco: Not on file  . Alcohol Use: No   OB History   Grav Para Term Preterm Abortions TAB SAB Ect Mult Living                 Review of Systems  Respiratory: Negative for shortness of breath.   Cardiovascular: Negative for chest pain.  Gastrointestinal: Negative for abdominal pain.  Neurological: Negative for headaches.  All other systems reviewed and are negative.    Allergies  Review of patient's  allergies indicates no known allergies.  Home Medications   Current Outpatient Rx  Name  Route  Sig  Dispense  Refill  . Cyanocobalamin (VITAMIN B-12 IJ)   Injection   Inject 1,000 mg as directed every 30 (thirty) days.          . cyclobenzaprine (FLEXERIL) 10 MG tablet   Oral   Take 1 tablet (10 mg total) by mouth at bedtime as needed for muscle spasms.   30 tablet   0   . ondansetron (ZOFRAN-ODT) 4 MG disintegrating tablet      TAKE 1 TABLET (4 MG TOTAL) BY MOUTH EVERY 8 (EIGHT) HOURS AS NEEDED FOR NAUSEA.   20 tablet   0   . traMADol (ULTRAM) 50 MG tablet   Oral   Take 1 tablet (50 mg total) by mouth daily as needed for pain.   30 tablet   0   . Vitamin D, Ergocalciferol, (DRISDOL) 50000 UNITS CAPS   Oral   Take 50,000 Units by mouth every 7 (seven) days.           BP 128/83  Pulse 72  Resp 14  Wt 218 lb (98.884 kg)  BMI 31.28 kg/m2  SpO2 99% Physical Exam  Nursing note and vitals reviewed. Constitutional: She is oriented to person, place, and time. She appears  well-developed and well-nourished. No distress.  HENT:  Head: Normocephalic and atraumatic.  Eyes: Conjunctivae and EOM are normal. Pupils are equal, round, and reactive to light. No scleral icterus.  Neck: Normal range of motion.  Cardiovascular: Normal rate.   Pulmonary/Chest: Effort normal.  Abdominal: She exhibits no distension.  Neurological: She is alert and oriented to person, place, and time.  Skin: Skin is warm.  Psychiatric: She has a normal mood and affect.    musculoskeletal: Left ankle/foot: FROM, +TTP PLANTAR fascia left heel. No swelling or deformity or heat or bony tenderness.  No TTP medial/lateral malleolus, navicular, base of 5th, calcaneus, Achilles, proximal fibula.  No swelling.  No ecchymoses.  Distal neurovascular status is intact. Left calf, popliteal area, and left thigh/upper leg all within normal limits. No swelling or cords or heat or red streaks. Homans sign  negative.  ED Course  Procedures (including critical care time) Labs Review Labs Reviewed - No data to display Imaging Review No results found.  MDM   1. Plantar fasciitis of left foot    Discussed options. We both agree that x-ray not indicated at this time. Conservative treatment for the plantar fasciitis discussed.--Including massage, heat/ice, and wearing proper fitting shoes with arch support. Aleve, 2 twice a day p.c. Precautions discussed. Red flags discussed. Questions invited and answered. Patient voiced understanding and agreement.    Lajean Manes, MD 07/18/13 [phone]

## 2013-07-18 NOTE — ED Notes (Signed)
Danielle Hodges c/o left heel pain and pain to the ball of her left foot without injury x 4 days. No new shoes or excessive walking as of recent.

## 2013-08-12 ENCOUNTER — Other Ambulatory Visit: Payer: Self-pay | Admitting: Family Medicine

## 2013-08-12 MED ORDER — ONDANSETRON 4 MG PO TBDP: ORAL_TABLET | ORAL | Status: DC

## 2013-08-12 MED ORDER — TRAMADOL HCL 50 MG PO TABS: 50.0000 mg | ORAL_TABLET | Freq: Every day | ORAL | Status: DC | PRN

## 2013-08-27 ENCOUNTER — Encounter: Payer: Self-pay | Admitting: Family Medicine

## 2013-08-27 DIAGNOSIS — Z9884 Bariatric surgery status: Secondary | ICD-10-CM | POA: Insufficient documentation

## 2013-08-28 ENCOUNTER — Telehealth: Payer: Self-pay | Admitting: *Deleted

## 2013-08-28 NOTE — Telephone Encounter (Signed)
Called to inform pt's husband forms are up front and ready for p/u.Loralee Pacas Lynetta Copy made placed in scan.Loralee Pacas [place]

## 2013-09-18 ENCOUNTER — Other Ambulatory Visit: Payer: Self-pay | Admitting: Family Medicine

## 2013-09-19 ENCOUNTER — Other Ambulatory Visit: Payer: Self-pay | Admitting: *Deleted

## 2013-09-19 MED ORDER — ONDANSETRON 4 MG PO TBDP: ORAL_TABLET | ORAL | Status: AC

## 2013-10-23 ENCOUNTER — Other Ambulatory Visit: Payer: Self-pay | Admitting: Family Medicine

## 2013-10-24 MED ORDER — TRAMADOL HCL 50 MG PO TABS: 50.0000 mg | ORAL_TABLET | Freq: Every day | ORAL | Status: AC | PRN

## 2013-12-31 ENCOUNTER — Other Ambulatory Visit: Payer: Self-pay | Admitting: Family Medicine

## 2014-02-17 IMAGING — CR DG TIBIA/FIBULA 2V*L*
2 series · 2 of 2 positions shown · non-contrast
Comparison: None.

CLINICAL DATA: Left lower leg pain following an injury today.

LEFT TIBIA AND FIBULA - 2 VIEW

[view not recorded (1 of 2)]
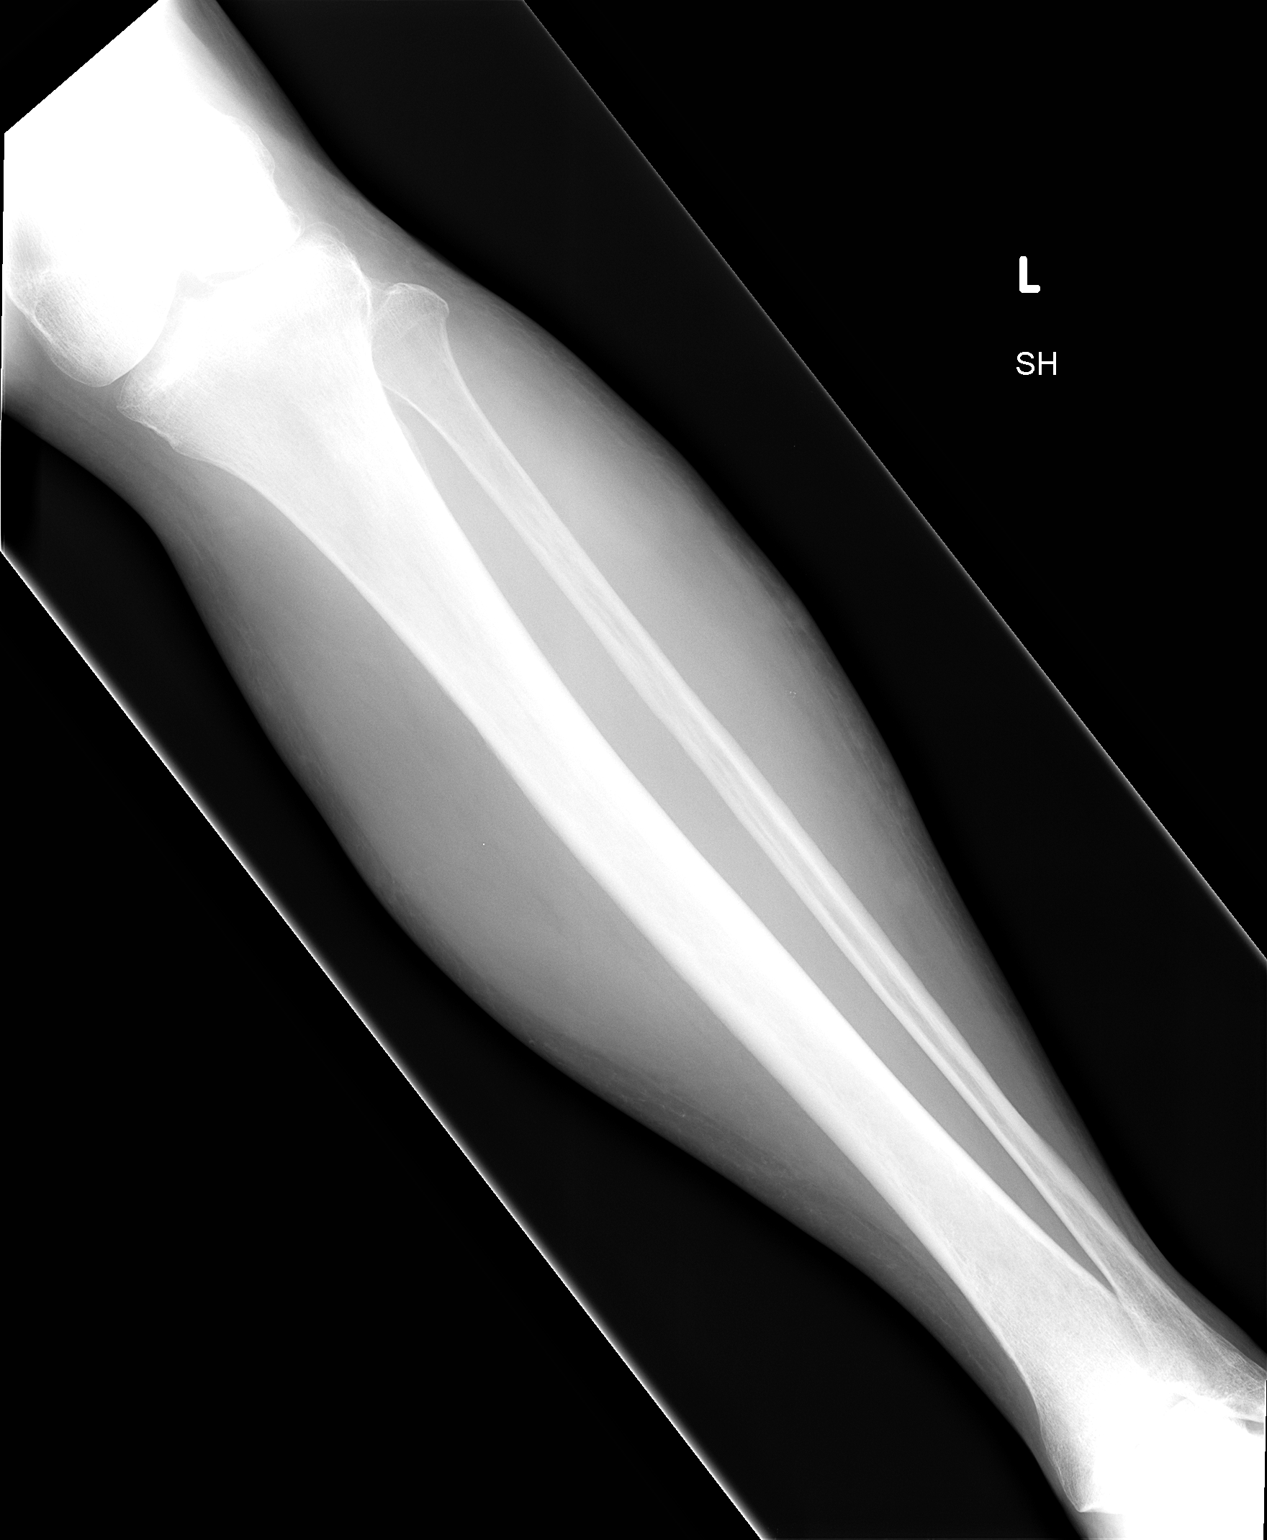

[view not recorded (2 of 2)]
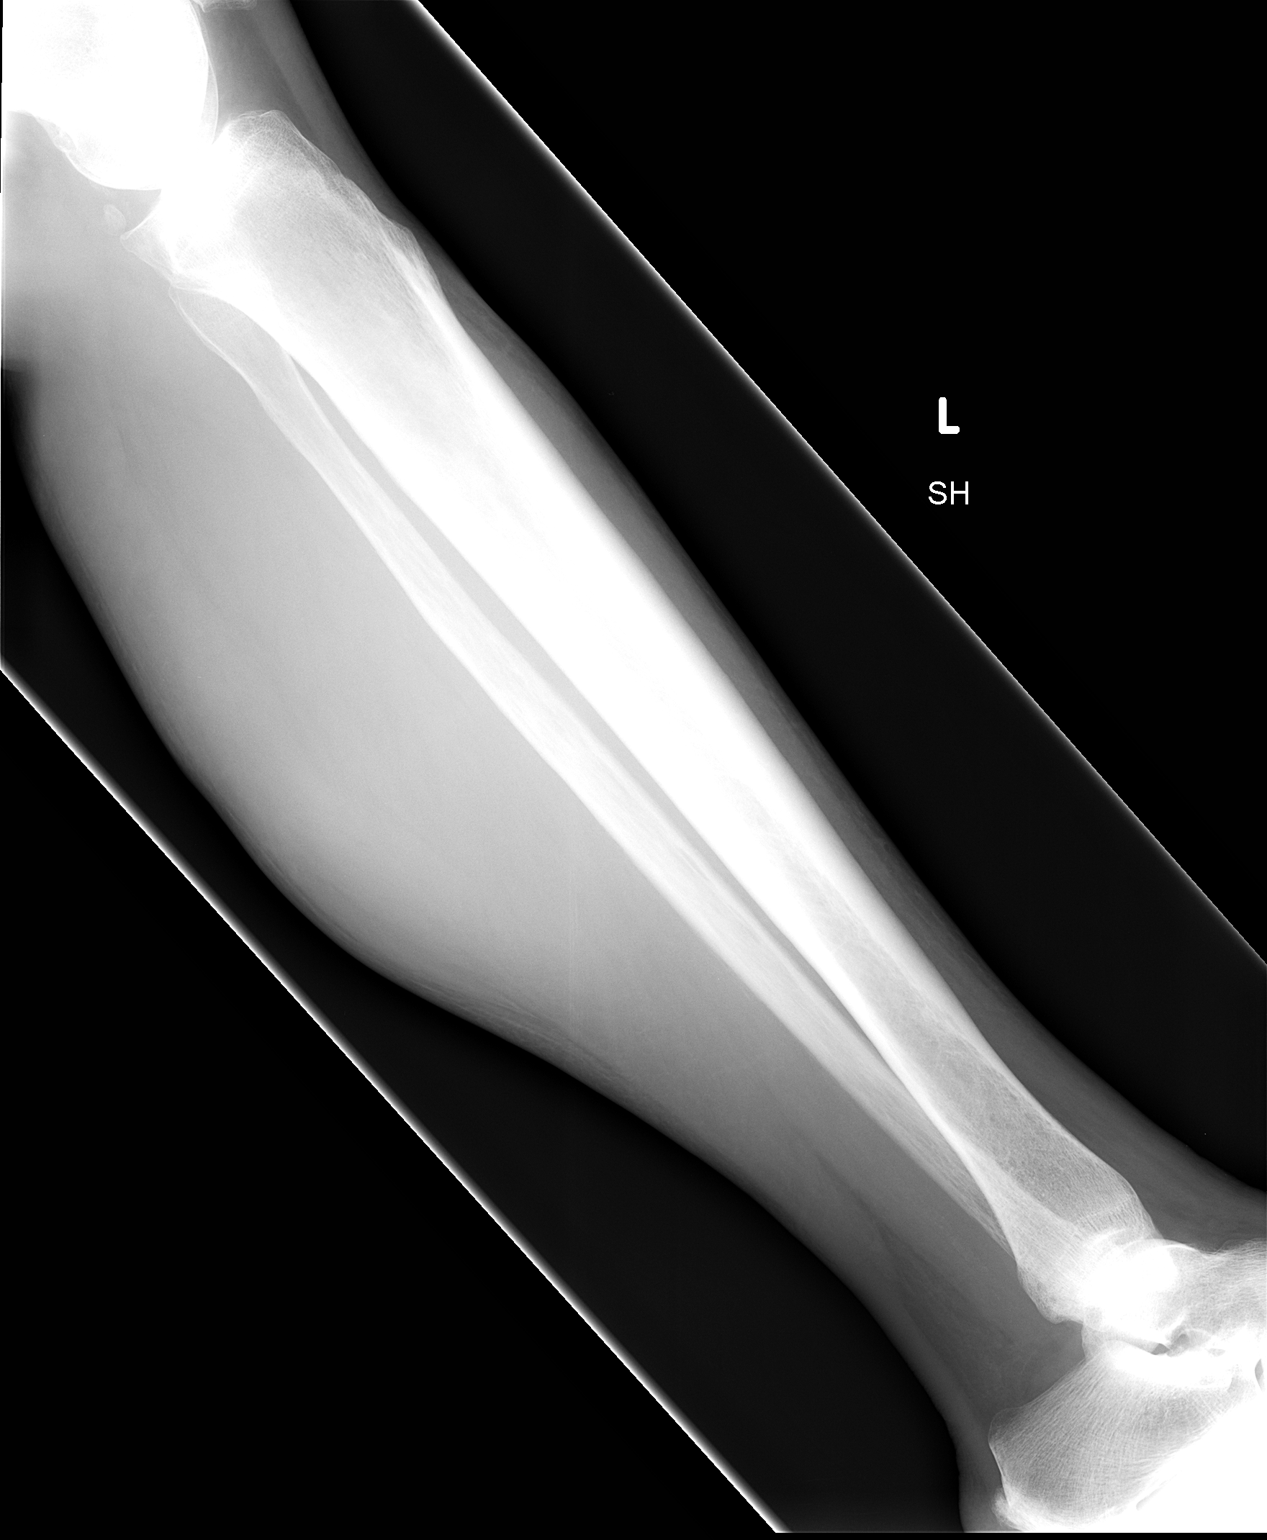

[2 of 2 positions shown; findings below may reference images not displayed]

FINDINGS: Mild lateral subcutaneous edema.  No fracture or
dislocation.
IMPRESSION: No fracture.

## 2014-05-08 ENCOUNTER — Encounter: Payer: Self-pay | Admitting: Emergency Medicine

## 2014-05-08 ENCOUNTER — Emergency Department
Admission: EM | Admit: 2014-05-08 | Discharge: 2014-05-08 | Disposition: A | Discharge status: Home / Self Care | Payer: BC Managed Care – PPO | Source: Home / Self Care

## 2014-05-08 DIAGNOSIS — IMO0002 Reserved for concepts with insufficient information to code with codable children: Secondary | ICD-10-CM

## 2014-05-08 DIAGNOSIS — S335XXA Sprain of ligaments of lumbar spine, initial encounter: Secondary | ICD-10-CM

## 2014-05-08 DIAGNOSIS — S76011A Strain of muscle, fascia and tendon of right hip, initial encounter: Secondary | ICD-10-CM

## 2014-05-08 MED ORDER — KETOROLAC TROMETHAMINE 60 MG/2ML IM SOLN
60.0000 mg | Freq: Once | INTRAMUSCULAR | Status: AC
  Administered 2014-05-08: 60 mg via INTRAMUSCULAR

## 2014-05-08 MED ORDER — CYCLOBENZAPRINE HCL 10 MG PO TABS: ORAL_TABLET | ORAL | Status: AC

## 2014-05-08 MED ORDER — IBUPROFEN 800 MG PO TABS: 800.0000 mg | ORAL_TABLET | Freq: Three times a day (TID) | ORAL | Status: AC

## 2014-05-08 NOTE — ED Provider Notes (Signed)
CSN: [id number]     Arrival date & time 05/08/14  [id number] History   None    Chief Complaint  Patient presents with  . Back Pain   (Consider location/radiation/quality/duration/timing/severity/associated sxs/prior Treatment) HPI Patient is a 43 year old female who presents to the clinic with right low back/gluteal pain that radiates through the buttocks. Patient rates the pain 5/10 and it started yesterday. She denies any radiation into her abdomen. She denies any urinary frequency, dysuria or flank pain. She is in the process of moving and has been picking up a lot of boxes. There was no known incident that caused worsening pain. She woke up yesterday morning and noticed the discomfort. Sitting seems to make worse. Leaning forward seems to make better while sitting. The best is to lay completely flat. There is worsening pain with any twisting motion. She denies any fever, chills, nausea or vomiting. She denies any history of past back trauma or pain. She has not taken anything by mouth to make better.  Past Medical History  Diagnosis Date  . Diabetes mellitus   . Hyperlipidemia   . Hypertension    Past Surgical History  Procedure Laterality Date  . Orif wrist fracture    . Ovarian cyst removal    . Uterine ablation    . Lithotripsy  march 2013  . 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 Hernia repair  03/21/12   Family History  Problem Relation Age of Onset  . Esophageal cancer Father     environmental exposure   History  Substance Use Topics  . Smoking status: Never Smoker   . Smokeless tobacco: Not on file  . Alcohol Use: No   OB History   Grav Para Term Preterm Abortions TAB SAB Ect Mult Living                 Review of Systems  All other systems reviewed and are negative.   Allergies  Review of patient's allergies indicates no known allergies.  Home Medications   Prior to Admission medications   Medication Sig Start Date End Date Taking? Authorizing  Provider  Cyanocobalamin (VITAMIN B-12 IJ) Inject 1,000 mg as directed every 30 (thirty) days.     Historical Provider, MD  cyclobenzaprine (FLEXERIL) 10 MG tablet One tablet as needed by mouth up to three times a day. 05/08/14   Jade L Breeback, PA-C  ibuprofen (ADVIL,MOTRIN) 800 MG tablet Take 1 tablet (800 mg total) by mouth 3 (three) times daily. 05/08/14   Jade L Breeback, PA-C  ondansetron (ZOFRAN-ODT) 4 MG disintegrating tablet TAKE 1 TABLET (4 MG TOTAL) BY MOUTH EVERY 8 (EIGHT) HOURS AS NEEDED FOR NAUSEA. 09/19/13   Agapito Gamesatherine D Metheney, MD  traMADol (ULTRAM) 50 MG tablet Take 1 tablet (50 mg total) by mouth daily as needed. 10/24/13   Agapito Gamesatherine D Metheney, MD  Vitamin D, Ergocalciferol, (DRISDOL) 50000 UNITS CAPS Take 50,000 Units by mouth every 7 (seven) days.     Historical Provider, MD   BP 101/66  Pulse 84  Temp(Src) 98 F (36.7 C) (Oral)  Resp 16  Ht 5\' 10"  (1.778 m)  Wt 222 lb (100.699 kg)  BMI 31.85 kg/m2  SpO2 98% Physical Exam  Constitutional: She is oriented to person, place, and time. She appears well-developed and well-nourished.  HENT:  Head: Normocephalic and atraumatic.  Musculoskeletal:  Negative for pain with palpation over spine from cervical down to lumbar. There is distinct pain to palpation over  right lower back into gluteal area. There is also significant tightness tonight.  Patellar reflexes 1+ and symmetric, hyporeflexive. Strength bilateral lower extremities 5/5.   No pain to palpation over the greater trochanter of right or left hip. ROM at hip normal without pain.   Range of motion at the waist limited due to pain. Worse with extension of back at the waist. Side to side ROM with some slight discomfort.   Neurological: She is alert and oriented to person, place, and time.  Skin: Skin is dry.  Psychiatric: She has a normal mood and affect. Her behavior is normal.    ED Course  Procedures (including critical care time) Labs Review Labs Reviewed -  No data to display  Imaging Review No results found.   MDM   1. Muscle strain of gluteal region, right, initial encounter   2. Low back sprain, initial encounter    Toradol 60 mg IM given in office today. Patient waited 20 minutes without complication. Flexeril 10mg  given for muscle relaxation. Warned of sedation.  Ibuprofen 800mg  TID for next 5 days, then as needed for pain.  Stretches were given to help loosen muscles.  Warm heating pad/shower/epson salt bath.  No moving boxes for next week.  When starts back moving boxes discussed proper lifting techniques.  No Red flags today.  Discussed worsening or changing pain to follow up with PCP in next 5-7 days.     Jomarie Longs, PA-C 05/08/14 [phone]

## 2014-05-08 NOTE — ED Notes (Signed)
Reports onset of lower right lateral back pain yesterday.

## 2014-05-08 NOTE — Discharge Instructions (Signed)
Low Back Sprain with Rehab  A sprain is an injury in which a ligament is torn. The ligaments of the lower back are vulnerable to sprains. However, they are strong and require great force to be injured. These ligaments are important for stabilizing the spinal column. Sprains are classified into three categories. Grade 1 sprains cause pain, but the tendon is not lengthened. Grade 2 sprains include a lengthened ligament, due to the ligament being stretched or partially ruptured. With grade 2 sprains there is still function, although the function may be decreased. Grade 3 sprains involve a complete tear of the tendon or muscle, and function is usually impaired. SYMPTOMS   Severe pain in the lower back.  Sometimes, a feeling of a "pop," "snap," or tear, at the time of injury.  Tenderness and sometimes swelling at the injury site.  Uncommonly, bruising (contusion) within 48 hours of injury.  Muscle spasms in the back. CAUSES  Low back sprains occur when a force is placed on the ligaments that is greater than they can handle. Common causes of injury include:  Performing a stressful act while off-balance.  Repetitive stressful activities that involve movement of the lower back.  Direct hit (trauma) to the lower back. RISK INCREASES WITH:  Contact sports (football, wrestling).  Collisions (major skiing accidents).  Sports that require throwing or lifting (baseball, weightlifting).  Sports involving twisting of the spine (gymnastics, diving, tennis, golf).  Poor strength and flexibility.  Inadequate protection.  Previous back injury or surgery (especially fusion). PREVENTION  Wear properly fitted and padded protective equipment.  Warm up and stretch properly before activity.  Allow for adequate recovery between workouts.  Maintain physical fitness:  Strength, flexibility, and endurance.  Cardiovascular fitness.  Maintain a healthy body weight. PROGNOSIS  If treated  properly, low back sprains usually heal with non-surgical treatment. The length of time for healing depends on the severity of the injury.  RELATED COMPLICATIONS   Recurring symptoms, resulting in a chronic problem.  Chronic inflammation and pain in the low back.  Delayed healing or resolution of symptoms, especially if activity is resumed too soon.  Prolonged impairment.  Unstable or arthritic joints of the low back. TREATMENT  Treatment first involves the use of ice and medicine, to reduce pain and inflammation. The use of strengthening and stretching exercises may help reduce pain with activity. These exercises may be performed at home or with a therapist. Severe injuries may require referral to a therapist for further evaluation and treatment, such as ultrasound. Your caregiver may advise that you wear a back brace or corset, to help reduce pain and discomfort. Often, prolonged bed rest results in greater harm then benefit. Corticosteroid injections may be recommended. However, these should be reserved for the most serious cases. It is important to avoid using your back when lifting objects. At night, sleep on your back on a firm mattress, with a pillow placed under your knees. If non-surgical treatment is unsuccessful, surgery may be needed.  MEDICATION   If pain medicine is needed, nonsteroidal anti-inflammatory medicines (aspirin and ibuprofen), or other minor pain relievers (acetaminophen), are often advised.  Do not take pain medicine for 7 days before surgery.  Prescription pain relievers may be given, if your caregiver thinks they are needed. Use only as directed and only as much as you need.  Ointments applied to the skin may be helpful.  Corticosteroid injections may be given by your caregiver. These injections should be reserved for the most serious cases,   because they may only be given a certain number of times. HEAT AND COLD  Cold treatment (icing) should be applied for 10  to 15 minutes every 2 to 3 hours for inflammation and pain, and immediately after activity that aggravates your symptoms. Use ice packs or an ice massage.  Heat treatment may be used before performing stretching and strengthening activities prescribed by your caregiver, physical therapist, or athletic trainer. Use a heat pack or a warm water soak. SEEK MEDICAL CARE IF:   Symptoms get worse or do not improve in 2 to 4 weeks, despite treatment.  You develop numbness or weakness in either leg.  You lose bowel or bladder function.  Any of the following occur after surgery: fever, increased pain, swelling, redness, drainage of fluids, or bleeding in the affected area.  New, unexplained symptoms develop. (Drugs used in treatment may produce side effects.) EXERCISES  RANGE OF MOTION (ROM) AND STRETCHING EXERCISES - Low Back Sprain Most people with lower back pain will find that their symptoms get worse with excessive bending forward (flexion) or arching at the lower back (extension). The exercises that will help resolve your symptoms will focus on the opposite motion.  Your physician, physical therapist or athletic trainer will help you determine which exercises will be most helpful to resolve your lower back pain. Do not complete any exercises without first consulting with your caregiver. Discontinue any exercises which make your symptoms worse, until you speak to your caregiver. If you have pain, numbness or tingling which travels down into your buttocks, leg or foot, the goal of the therapy is for these symptoms to move closer to your back and eventually resolve. Sometimes, these leg symptoms will get better, but your lower back pain may worsen. This is often an indication of progress in your rehabilitation. Be very alert to any changes in your symptoms and the activities in which you participated in the 24 hours prior to the change. Sharing this information with your caregiver will allow him or her to  most efficiently treat your condition. These exercises may help you when beginning to rehabilitate your injury. Your symptoms may resolve with or without further involvement from your physician, physical therapist or athletic trainer. While completing these exercises, remember:   Restoring tissue flexibility helps normal motion to return to the joints. This allows healthier, less painful movement and activity.  An effective stretch should be held for at least 30 seconds.  A stretch should never be painful. You should only feel a gentle lengthening or release in the stretched tissue. FLEXION RANGE OF MOTION AND STRETCHING EXERCISES: STRETCH - Flexion, Single Knee to Chest   Lie on a firm bed or floor with both legs extended in front of you.  Keeping one leg in contact with the floor, bring your opposite knee to your chest. Hold your leg in place by either grabbing behind your thigh or at your knee.  Pull until you feel a gentle stretch in your low back. Hold __________ seconds.  Slowly release your grasp and repeat the exercise with the opposite side. Repeat __________ times. Complete this exercise __________ times per day.  STRETCH - Flexion, Double Knee to Chest  Lie on a firm bed or floor with both legs extended in front of you.  Keeping one leg in contact with the floor, bring your opposite knee to your chest.  Tense your stomach muscles to support your back and then lift your other knee to your chest. Hold your legs   in place by either grabbing behind your thighs or at your knees.  Pull both knees toward your chest until you feel a gentle stretch in your low back. Hold __________ seconds.  Tense your stomach muscles and slowly return one leg at a time to the floor. Repeat __________ times. Complete this exercise __________ times per day.  STRETCH - Low Trunk Rotation  Lie on a firm bed or floor. Keeping your legs in front of you, bend your knees so they are both pointed toward the  ceiling and your feet are flat on the floor.  Extend your arms out to the side. This will stabilize your upper body by keeping your shoulders in contact with the floor.  Gently and slowly drop both knees together to one side until you feel a gentle stretch in your low back. Hold for __________ seconds.  Tense your stomach muscles to support your lower back as you bring your knees back to the starting position. Repeat the exercise to the other side. Repeat __________ times. Complete this exercise __________ times per day  EXTENSION RANGE OF MOTION AND FLEXIBILITY EXERCISES: STRETCH - Extension, Prone on Elbows   Lie on your stomach on the floor, a bed will be too soft. Place your palms about shoulder width apart and at the height of your head.  Place your elbows under your shoulders. If this is too painful, stack pillows under your chest.  Allow your body to relax so that your hips drop lower and make contact more completely with the floor.  Hold this position for __________ seconds.  Slowly return to lying flat on the floor. Repeat __________ times. Complete this exercise __________ times per day.  RANGE OF MOTION - Extension, Prone Press Ups  Lie on your stomach on the floor, a bed will be too soft. Place your palms about shoulder width apart and at the height of your head.  Keeping your back as relaxed as possible, slowly straighten your elbows while keeping your hips on the floor. You may adjust the placement of your hands to maximize your comfort. As you gain motion, your hands will come more underneath your shoulders.  Hold this position __________ seconds.  Slowly return to lying flat on the floor. Repeat __________ times. Complete this exercise __________ times per day.  RANGE OF MOTION- Quadruped, Neutral Spine   Assume a hands and knees position on a firm surface. Keep your hands under your shoulders and your knees under your hips. You may place padding under your knees for  comfort.  Drop your head and point your tailbone toward the ground below you. This will round out your lower back like an angry cat. Hold this position for __________ seconds.  Slowly lift your head and release your tail bone so that your back sags into a large arch, like an old horse.  Hold this position for __________ seconds.  Repeat this until you feel limber in your low back.  Now, find your "sweet spot." This will be the most comfortable position somewhere between the two previous positions. This is your neutral spine. Once you have found this position, tense your stomach muscles to support your low back.  Hold this position for __________ seconds. Repeat __________ times. Complete this exercise __________ times per day.  STRENGTHENING EXERCISES - Low Back Sprain These exercises may help you when beginning to rehabilitate your injury. These exercises should be done near your "sweet spot." This is the neutral, low-back arch, somewhere between fully rounded   and fully arched, that is your least painful position. When performed in this safe range of motion, these exercises can be used for people who have either a flexion or extension based injury. These exercises may resolve your symptoms with or without further involvement from your physician, physical therapist or athletic trainer. While completing these exercises, remember:   Muscles can gain both the endurance and the strength needed for everyday activities through controlled exercises.  Complete these exercises as instructed by your physician, physical therapist or athletic trainer. Increase the resistance and repetitions only as guided.  You may experience muscle soreness or fatigue, but the pain or discomfort you are trying to eliminate should never worsen during these exercises. If this pain does worsen, stop and make certain you are following the directions exactly. If the pain is still present after adjustments, discontinue the  exercise until you can discuss the trouble with your caregiver. STRENGTHENING - Deep Abdominals, Pelvic Tilt   Lie on a firm bed or floor. Keeping your legs in front of you, bend your knees so they are both pointed toward the ceiling and your feet are flat on the floor.  Tense your lower abdominal muscles to press your low back into the floor. This motion will rotate your pelvis so that your tail bone is scooping upwards rather than pointing at your feet or into the floor. With a gentle tension and even breathing, hold this position for __________ seconds. Repeat __________ times. Complete this exercise __________ times per day.  STRENGTHENING - Abdominals, Crunches   Lie on a firm bed or floor. Keeping your legs in front of you, bend your knees so they are both pointed toward the ceiling and your feet are flat on the floor. Cross your arms over your chest.  Slightly tip your chin down without bending your neck.  Tense your abdominals and slowly lift your trunk high enough to just clear your shoulder blades. Lifting higher can put excessive stress on the lower back and does not further strengthen your abdominal muscles.  Control your return to the starting position. Repeat __________ times. Complete this exercise __________ times per day.  STRENGTHENING - Quadruped, Opposite UE/LE Lift   Assume a hands and knees position on a firm surface. Keep your hands under your shoulders and your knees under your hips. You may place padding under your knees for comfort.  Find your neutral spine and gently tense your abdominal muscles so that you can maintain this position. Your shoulders and hips should form a rectangle that is parallel with the floor and is not twisted.  Keeping your trunk steady, lift your right hand no higher than your shoulder and then your left leg no higher than your hip. Make sure you are not holding your breath. Hold this position for __________ seconds.  Continuing to keep  your abdominal muscles tense and your back steady, slowly return to your starting position. Repeat with the opposite arm and leg. Repeat __________ times. Complete this exercise __________ times per day.  STRENGTHENING - Abdominals and Quadriceps, Straight Leg Raise   Lie on a firm bed or floor with both legs extended in front of you.  Keeping one leg in contact with the floor, bend the other knee so that your foot can rest flat on the floor.  Find your neutral spine, and tense your abdominal muscles to maintain your spinal position throughout the exercise.  Slowly lift your straight leg off the floor about 6 inches for a count   of 15, making sure to not hold your breath.  Still keeping your neutral spine, slowly lower your leg all the way to the floor. Repeat this exercise with each leg __________ times. Complete this exercise __________ times per day. POSTURE AND BODY MECHANICS CONSIDERATIONS - Low Back Sprain Keeping correct posture when sitting, standing or completing your activities will reduce the stress put on different body tissues, allowing injured tissues a chance to heal and limiting painful experiences. The following are general guidelines for improved posture. Your physician or physical therapist will provide you with any instructions specific to your needs. While reading these guidelines, remember:  The exercises prescribed by your provider will help you have the flexibility and strength to maintain correct postures.  The correct posture provides the best environment for your joints to work. All of your joints have less wear and tear when properly supported by a spine with good posture. This means you will experience a healthier, less painful body.  Correct posture must be practiced with all of your activities, especially prolonged sitting and standing. Correct posture is as important when doing repetitive low-stress activities (typing) as it is when doing a single heavy-load  activity (lifting). RESTING POSITIONS Consider which positions are most painful for you when choosing a resting position. If you have pain with flexion-based activities (sitting, bending, stooping, squatting), choose a position that allows you to rest in a less flexed posture. You would want to avoid curling into a fetal position on your side. If your pain worsens with extension-based activities (prolonged standing, working overhead), avoid resting in an extended position such as sleeping on your stomach. Most people will find more comfort when they rest with their spine in a more neutral position, neither too rounded nor too arched. Lying on a non-sagging bed on your side with a pillow between your knees, or on your back with a pillow under your knees will often provide some relief. Keep in mind, being in any one position for a prolonged period of time, no matter how correct your posture, can still lead to stiffness. PROPER SITTING POSTURE In order to minimize stress and discomfort on your spine, you must sit with correct posture. Sitting with good posture should be effortless for a healthy body. Returning to good posture is a gradual process. Many people can work toward this most comfortably by using various supports until they have the flexibility and strength to maintain this posture on their own. When sitting with proper posture, your ears will fall over your shoulders and your shoulders will fall over your hips. You should use the back of the chair to support your upper back. Your lower back will be in a neutral position, just slightly arched. You may place a small pillow or folded towel at the base of your lower back for  support.  When working at a desk, create an environment that supports good, upright posture. Without extra support, muscles tire, which leads to excessive strain on joints and other tissues. Keep these recommendations in mind: CHAIR:  A chair should be able to slide under your desk  when your back makes contact with the back of the chair. This allows you to work closely.  The chair's height should allow your eyes to be level with the upper part of your monitor and your hands to be slightly lower than your elbows. BODY POSITION  Your feet should make contact with the floor. If this is not possible, use a foot rest.  Keep your   ears over your shoulders. This will reduce stress on your neck and low back. INCORRECT SITTING POSTURES  If you are feeling tired and unable to assume a healthy sitting posture, do not slouch or slump. This puts excessive strain on your back tissues, causing more damage and pain. Healthier options include:  Using more support, like a lumbar pillow.  Switching tasks to something that requires you to be upright or walking.  Talking a brief walk.  Lying down to rest in a neutral-spine position. PROLONGED STANDING WHILE SLIGHTLY LEANING FORWARD  When completing a task that requires you to lean forward while standing in one place for a long time, place either foot up on a stationary 2-4 inch high object to help maintain the best posture. When both feet are on the ground, the lower back tends to lose its slight inward curve. If this curve flattens (or becomes too large), then the back and your other joints will experience too much stress, tire more quickly, and can cause pain. CORRECT STANDING POSTURES Proper standing posture should be assumed with all daily activities, even if they only take a few moments, like when brushing your teeth. As in sitting, your ears should fall over your shoulders and your shoulders should fall over your hips. You should keep a slight tension in your abdominal muscles to brace your spine. Your tailbone should point down to the ground, not behind your body, resulting in an over-extended swayback posture.  INCORRECT STANDING POSTURES  Common incorrect standing postures include a forward head, locked knees and/or an excessive  swayback. WALKING Walk with an upright posture. Your ears, shoulders and hips should all line-up. PROLONGED ACTIVITY IN A FLEXED POSITION When completing a task that requires you to bend forward at your waist or lean over a low surface, try to find a way to stabilize 3 out of 4 of your limbs. You can place a hand or elbow on your thigh or rest a knee on the surface you are reaching across. This will provide you more stability, so that your muscles do not tire as quickly. By keeping your knees relaxed, or slightly bent, you will also reduce stress across your lower back. CORRECT LIFTING TECHNIQUES DO :  Assume a wide stance. This will provide you more stability and the opportunity to get as close as possible to the object which you are lifting.  Tense your abdominals to brace your spine. Bend at the knees and hips. Keeping your back locked in a neutral-spine position, lift using your leg muscles. Lift with your legs, keeping your back straight.  Test the weight of unknown objects before attempting to lift them.  Try to keep your elbows locked down at your sides in order get the best strength from your shoulders when carrying an object.  Always ask for help when lifting heavy or awkward objects. INCORRECT LIFTING TECHNIQUES DO NOT:   Lock your knees when lifting, even if it is a small object.  Bend and twist. Pivot at your feet or move your feet when needing to change directions.  Assume that you can safely pick up even a paperclip without proper posture. Document Released: 09/26/2005 Document Revised: 12/19/2011 Document Reviewed: 01/08/2009 [employer] Patient Information 2015 [employer]. This information is not intended to replace advice given to you by your health care provider. Make sure you discuss any questions you have with your health care provider.  

## 2014-05-11 NOTE — ED Provider Notes (Signed)
Agree with exam, assessment, and plan.   Stephen A Beese, MD 05/11/14 0935 

## 2014-05-12 ENCOUNTER — Emergency Department
Admission: EM | Admit: 2014-05-12 | Discharge: 2014-05-12 | Disposition: A | Discharge status: Home / Self Care | Payer: BC Managed Care – PPO | Source: Home / Self Care | Attending: Family Medicine | Admitting: Family Medicine

## 2014-05-12 ENCOUNTER — Encounter: Payer: Self-pay | Admitting: Emergency Medicine

## 2014-05-12 ENCOUNTER — Encounter: Payer: Self-pay | Admitting: Family Medicine

## 2014-05-12 DIAGNOSIS — B353 Tinea pedis: Secondary | ICD-10-CM | POA: Diagnosis not present

## 2014-05-12 DIAGNOSIS — L03116 Cellulitis of left lower limb: Secondary | ICD-10-CM

## 2014-05-12 DIAGNOSIS — L03119 Cellulitis of unspecified part of limb: Secondary | ICD-10-CM

## 2014-05-12 DIAGNOSIS — L02419 Cutaneous abscess of limb, unspecified: Secondary | ICD-10-CM | POA: Diagnosis not present

## 2014-05-12 MED ORDER — DOXYCYCLINE HYCLATE 100 MG PO CAPS: 100.0000 mg | ORAL_CAPSULE | Freq: Two times a day (BID) | ORAL | Status: AC

## 2014-05-12 MED ORDER — CEFUROXIME AXETIL 500 MG PO TABS: 500.0000 mg | ORAL_TABLET | Freq: Two times a day (BID) | ORAL | Status: AC

## 2014-05-12 NOTE — Discharge Instructions (Signed)
Thank you for coming in today. Take both oral antibiotics twice daily for cellulitis.  Use over-the-counter Lamisil cream for athlete's foot.  Followup with your primary care Dr. as needed.  If getting dramatically worse go to the emergency department.  Come back as needed.   Cellulitis Cellulitis is an infection of the skin and the tissue beneath it. The infected area is usually red and tender. Cellulitis occurs most often in the arms and lower legs.  CAUSES  Cellulitis is caused by bacteria that enter the skin through cracks or cuts in the skin. The most common types of bacteria that cause cellulitis are staphylococci and streptococci. SIGNS AND SYMPTOMS   Redness and warmth.  Swelling.  Tenderness or pain.  Fever. DIAGNOSIS  Your health care provider can usually determine what is wrong based on a physical exam. Blood tests may also be done. TREATMENT  Treatment usually involves taking an antibiotic medicine. HOME CARE INSTRUCTIONS   Take your antibiotic medicine as directed by your health care provider. Finish the antibiotic even if you start to feel better.  Keep the infected arm or leg elevated to reduce swelling.  Apply a warm cloth to the affected area up to 4 times per day to relieve pain.  Take medicines only as directed by your health care provider.  Keep all follow-up visits as directed by your health care provider. SEEK MEDICAL CARE IF:   You notice red streaks coming from the infected area.  Your red area gets larger or turns dark in color.  Your bone or joint underneath the infected area becomes painful after the skin has healed.  Your infection returns in the same area or another area.  You notice a swollen bump in the infected area.  You develop new symptoms.  You have a fever. SEEK IMMEDIATE MEDICAL CARE IF:   You feel very sleepy.  You develop vomiting or diarrhea.  You have a general ill feeling (malaise) with muscle aches and pains. MAKE  SURE YOU:   Understand these instructions.  Will watch your condition.  Will get help right away if you are not doing well or get worse. Document Released: 07/06/2005 Document Revised: 02/10/2014 Document Reviewed: 12/12/2011 [facility] Patient Information 2015 [place], [place]. This information is not intended to replace advice given to you by your health care provider. Make sure you discuss any questions you have with your health care provider.

## 2014-05-12 NOTE — ED Notes (Signed)
Left foot cellulitis started today, burning

## 2014-05-12 NOTE — ED Provider Notes (Signed)
Danielle CampbellCynthia Hodges is a 43 y.o. female who presents to Urgent Care today for cellulitis. Patient has a history of mild athlete's foot involving both feet. Yesterday evening she noted pain swelling and redness at the left lateral foot. The redness and pain worsened this morning. Her symptoms are consistent with previous episodes of cellulitis. She has not tried any treatment yet. She feels well otherwise.   Past Medical History  Diagnosis Date  . Diabetes mellitus   . Hyperlipidemia   . Hypertension    History  Substance Use Topics  . Smoking status: Never Smoker   . Smokeless tobacco: Not on file  . Alcohol Use: No   ROS as above Medications: No current facility-administered medications for this encounter.   Current Outpatient Prescriptions  Medication Sig Dispense Refill  . cefUROXime (CEFTIN) 500 MG tablet Take 1 tablet (500 mg total) by mouth 2 (two) times daily with a meal.  14 tablet  0  . Cyanocobalamin (VITAMIN B-12 IJ) Inject 1,000 mg as directed every 30 (thirty) days.       . cyclobenzaprine (FLEXERIL) 10 MG tablet One tablet as needed by mouth up to three times a day.  30 tablet  0  . doxycycline (VIBRAMYCIN) 100 MG capsule Take 1 capsule (100 mg total) by mouth 2 (two) times daily.  14 capsule  0  . ibuprofen (ADVIL,MOTRIN) 800 MG tablet Take 1 tablet (800 mg total) by mouth 3 (three) times daily.  60 tablet  0  . ondansetron (ZOFRAN-ODT) 4 MG disintegrating tablet TAKE 1 TABLET (4 MG TOTAL) BY MOUTH EVERY 8 (EIGHT) HOURS AS NEEDED FOR NAUSEA.  20 tablet  0  . traMADol (ULTRAM) 50 MG tablet Take 1 tablet (50 mg total) by mouth daily as needed.  30 tablet  0  . Vitamin D, Ergocalciferol, (DRISDOL) 50000 UNITS CAPS Take 50,000 Units by mouth every 7 (seven) days.         Exam:  BP 125/75  Pulse 78  Temp(Src) 98.7 F (37.1 C) (Oral)  Ht 5\' 10"  (1.778 m)  Wt 220 lb (99.791 kg)  BMI 31.57 kg/m2  SpO2 98% Gen: Well NAD HEENT: EOMI,  MMM Lungs: Normal work of breathing.  CTABL Heart: RRR no MRG Abd: NABS, Soft. Nondistended, Nontender Exts: Brisk capillary refill, warm and well perfused.  Left foot: Crusting and erythema and mild induration and tenderness interdigital webspace left fourth and fifth toes extending dorsally to 3 cm.  Capillary refill is intact  No results found for this or any previous visit (from the past 24 hour(s)). No results found.  Assessment and Plan: 43 y.o. female with cellulitis of the left foot in the setting of tinea pedis. Treatment with oral Ceftin and doxycycline. Topical Lamisil. Followup with primary care provider.  Discussed warning signs or symptoms. Please see discharge instructions. Patient expresses understanding.   This note was created using Conservation officer, historic buildingsDragon voice recognition software. Any transcription errors are unintended.    Rodolph BongEvan S Zareth Rippetoe, MD 05/12/14 1040
